# Patient Record
Sex: Female | Born: 1980 | Race: White | Hispanic: No | Marital: Single | Smoking: Never smoker
Health system: Southern US, Community
[De-identification: ages and names within clinical notes are randomized; demographics above are authoritative.]

## PROBLEM LIST (undated history)

## (undated) DIAGNOSIS — F32A Depression, unspecified: Secondary | ICD-10-CM

## (undated) DIAGNOSIS — K219 Gastro-esophageal reflux disease without esophagitis: Secondary | ICD-10-CM

## (undated) DIAGNOSIS — F329 Major depressive disorder, single episode, unspecified: Secondary | ICD-10-CM

## (undated) DIAGNOSIS — G43909 Migraine, unspecified, not intractable, without status migrainosus: Secondary | ICD-10-CM

## (undated) DIAGNOSIS — F419 Anxiety disorder, unspecified: Secondary | ICD-10-CM

## (undated) DIAGNOSIS — R42 Dizziness and giddiness: Secondary | ICD-10-CM

## (undated) DIAGNOSIS — C569 Malignant neoplasm of unspecified ovary: Secondary | ICD-10-CM

## (undated) HISTORY — DX: Anxiety disorder, unspecified: F41.9

## (undated) HISTORY — DX: Malignant neoplasm of unspecified ovary: C56.9

## (undated) HISTORY — DX: Dizziness and giddiness: R42

## (undated) HISTORY — DX: Migraine, unspecified, not intractable, without status migrainosus: G43.909

---

## 2006-03-10 DIAGNOSIS — C569 Malignant neoplasm of unspecified ovary: Secondary | ICD-10-CM

## 2006-03-10 HISTORY — DX: Malignant neoplasm of unspecified ovary: C56.9

## 2006-07-09 HISTORY — PX: SALPINGOOPHORECTOMY: SHX82

## 2006-08-09 HISTORY — PX: APPENDECTOMY: SHX54

## 2007-02-08 HISTORY — PX: BUNIONECTOMY: SHX129

## 2008-07-08 HISTORY — PX: CYSTECTOMY: SUR359

## 2011-11-06 ENCOUNTER — Encounter: Payer: Self-pay | Admitting: Gynecologic Oncology

## 2011-11-06 ENCOUNTER — Other Ambulatory Visit (HOSPITAL_COMMUNITY)
Admission: RE | Admit: 2011-11-06 | Discharge: 2011-11-06 | Disposition: A | Discharge status: Home / Self Care | Payer: BC Managed Care – PPO | Source: Ambulatory Visit | Attending: Gynecologic Oncology | Admitting: Gynecologic Oncology

## 2011-11-06 ENCOUNTER — Ambulatory Visit: Payer: BC Managed Care – PPO

## 2011-11-06 ENCOUNTER — Ambulatory Visit: Payer: BC Managed Care – PPO | Attending: Gynecologic Oncology | Admitting: Gynecologic Oncology

## 2011-11-06 VITALS — BP 98/58 | HR 68 | Temp 99.0°F | Resp 18 | Ht 60.04 in | Wt 111.3 lb

## 2011-11-06 DIAGNOSIS — Z01419 Encounter for gynecological examination (general) (routine) without abnormal findings: Secondary | ICD-10-CM | POA: Insufficient documentation

## 2011-11-06 DIAGNOSIS — C569 Malignant neoplasm of unspecified ovary: Secondary | ICD-10-CM | POA: Insufficient documentation

## 2011-11-06 NOTE — Addendum Note (Signed)
Addended by: Cleda Mccreedy A on: 11/06/2011 03:47 PM   Modules accepted: Orders

## 2011-11-06 NOTE — Progress Notes (Signed)
Consult Note: Gyn-Onc  Sharon Holmes 31 y.o. female  CC:  Chief Complaint  Patient presents with  . Ovarian Cancer    Follow up    HPI: Patient is seen today in consultation at the request of Dr. Shaune Pollack for followup of ovarian cancer.  31 year old gravida 0 and 2008 began feeling some fatigue bloating and was having some bleeding every week. She was told her that she might have polycystic ovarian syndrome. She then developed some sharp left-sided shoulder pain went to her local emergency room where an MRI was performed it revealed a 17 cm ovarian mass. She underwent exploratory laparotomy left salpingo-oophorectomy that revealed a this early stage ovarian cancer. She does not know the histology and we do not have a record of her final pathology. After that she was referred to Dr. Charlyn Minerva a gynecologic oncologist in [place]. She underwent complete staging at that time which was completely negative and she was felt to have a stage IA ovarian cancer was dispositioned to close followup. Her CA 125 was elevated at the time of her diagnosis and has been followed closely. Her CA-125 has been elevated from time to time and most recently in October 2011 they were elevated at 74. In 2010 she had an evaluation for a right ovarian mass and underwent a right ovarian cystectomy that revealed endometriosis. She was also diagnosed with left-sided pelvic adhesions. She was seen by Dr. Kevan Ny August 15 and had a CA 125 done at that time that was elevated at 55.6. Her last Pap smear was in May of 2012.  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 Her cycles are regular her last cycle was 3 weeks ago she feels that her cycles are to see a. Her most recent imaging was a CT scan in October of 2011 that showed some right soft tissue density. She did have a followup ultrasound she does not know the results. She cannot undergo MRI as if she has the pain in her foot. She is  single.  Review of Systems: 10 point review of systems is negative.  Current Meds:  Outpatient Encounter Prescriptions as of 11/06/2011  Medication Sig Dispense Refill  . IRON PO Take 325 mg by mouth every other day. Once every other day      . SUMAtriptan (IMITREX) 50 MG tablet Take 50 mg by mouth every 2 (two) hours as needed.        Allergy:  Allergies  Allergen Reactions  . Amoxicillin     High fever and aches  . Aspirin     Throat gets itchy    Social Hx:   History   Social History  . Marital Status: Single    Spouse Name: N/A    Number of Children: N/A  . Years of Education: N/A   Occupational History  . Not on file.   Social History Main Topics  . Smoking status: Never Smoker   . Smokeless tobacco: Not on file  . Alcohol Use: Yes     occas  . Drug Use: Not on file  . Sexually Active: Not on file   Other Topics Concern  . Not on file   Social History Narrative  . No narrative on file    Past Surgical Hx:  Past Surgical History  Procedure Date  . Salpingoophorectomy 07/2006    Left for Stage 1A ovarian cancer  . Appendectomy 08/2006  . Bunionectomy 02/2007  . Cystectomy 07/2008  from right ovary    Past Medical Hx:  Past Medical History  Diagnosis Date  . Dizziness   . Migraine     with aura around menses  . Ovarian cancer 2008    Stage 1A    Family Hx:  Family History  Problem Relation Age of Onset  . Diabetes Maternal Aunt   . Heart attack Maternal Grandfather     Vitals:  Blood pressure 98/58, pulse 68, temperature 99 F (37.2 C), temperature source Oral, resp. rate 18, height 5' 0.04" (1.525 m), weight 111 lb 4.8 oz (50.485 kg), last menstrual period 10/13/2011.  Physical Exam: Well-nourished well-developed female in no acute distress.  Neck: Supple, no lymphadenopathy, no family.  Lungs: Clear to auscultation bilaterally.  Cardiovascular: Regular rate and rhythm.  Abdomen: Well-healed vertical midline incision laparoscopy  incisions. There is no evidence of any incisional hernias. Abdomen is soft and nontender.  Groins: No lymphadenopathy.  Extremities: No edema.  Pelvic: Normal external female genitalia. The vagina is well epithelialized. The cervix is visualized. There's a physiologic discharge. There's no visible lesions. ThinPrep Pap smear was performed without difficulty. Bimanual examination the uterus is freely mobile. It appears to be retroflexed. There is no adnexal masses. Rectal confirms no nodularity  Assessment/Plan: 31 year old with a stage IA unknown histology ovarian carcinoma diagnosed and treated in 2008 was no evidence of recurrent disease. She has intermittently had elevated CA 125. She does carry a known diagnosis of endometriosis. Her most recent CA 125 2 weeks ago was elevated at 55. We'll schedule her for transvaginal ultrasound. We will contact her with these results. Return to see me in 6 months.  Cleda Mccreedy A., MD 11/06/2011, 12:43 PM

## 2011-11-06 NOTE — Patient Instructions (Signed)
Follow up for ultrasound and RTC in 6 months.

## 2011-11-12 ENCOUNTER — Ambulatory Visit (HOSPITAL_COMMUNITY)
Admission: RE | Admit: 2011-11-12 | Discharge: 2011-11-12 | Disposition: A | Discharge status: Home / Self Care | Payer: BC Managed Care – PPO | Source: Ambulatory Visit | Attending: Gynecologic Oncology | Admitting: Gynecologic Oncology

## 2011-11-12 DIAGNOSIS — C569 Malignant neoplasm of unspecified ovary: Secondary | ICD-10-CM

## 2011-11-12 DIAGNOSIS — Z9079 Acquired absence of other genital organ(s): Secondary | ICD-10-CM | POA: Insufficient documentation

## 2011-11-12 DIAGNOSIS — R971 Elevated cancer antigen 125 [CA 125]: Secondary | ICD-10-CM | POA: Insufficient documentation

## 2011-11-14 ENCOUNTER — Telehealth: Payer: Self-pay | Admitting: Gynecologic Oncology

## 2011-11-14 NOTE — Telephone Encounter (Signed)
Message left for patient with pap smear and ultrasound results.  Informed to follow up with Dr. Duard Brady in 6 months.  Instructed to call for any questions or concerns.

## 2012-10-21 ENCOUNTER — Ambulatory Visit: Payer: BC Managed Care – PPO | Admitting: Lab

## 2012-10-21 ENCOUNTER — Encounter: Payer: Self-pay | Admitting: Gynecologic Oncology

## 2012-10-21 ENCOUNTER — Ambulatory Visit: Payer: BC Managed Care – PPO | Attending: Gynecologic Oncology | Admitting: Gynecologic Oncology

## 2012-10-21 ENCOUNTER — Other Ambulatory Visit (HOSPITAL_COMMUNITY)
Admission: RE | Admit: 2012-10-21 | Discharge: 2012-10-21 | Disposition: A | Discharge status: Home / Self Care | Payer: BC Managed Care – PPO | Source: Ambulatory Visit | Attending: Gynecologic Oncology | Admitting: Gynecologic Oncology

## 2012-10-21 VITALS — BP 100/60 | HR 74 | Temp 98.4°F | Resp 18 | Ht 60.04 in | Wt 111.0 lb

## 2012-10-21 DIAGNOSIS — L708 Other acne: Secondary | ICD-10-CM | POA: Insufficient documentation

## 2012-10-21 DIAGNOSIS — Z01419 Encounter for gynecological examination (general) (routine) without abnormal findings: Secondary | ICD-10-CM | POA: Insufficient documentation

## 2012-10-21 DIAGNOSIS — C569 Malignant neoplasm of unspecified ovary: Secondary | ICD-10-CM

## 2012-10-21 DIAGNOSIS — Z1151 Encounter for screening for human papillomavirus (HPV): Secondary | ICD-10-CM | POA: Insufficient documentation

## 2012-10-21 NOTE — Patient Instructions (Signed)
RTC 1 year

## 2012-10-21 NOTE — Progress Notes (Signed)
Consult Note: Gyn-Onc  Sharon Holmes 32 y.o. female  CC:  Chief Complaint  Patient presents with  . Ovarian Cancer    Follow up    HPI:   32 year old gravida 0 and 2008 began feeling some fatigue bloating and was having some bleeding every week. She was told her that she might have polycystic ovarian syndrome. She then developed some sharp left-sided shoulder pain went to her local emergency room where an MRI was performed it revealed a 17 cm ovarian mass. She underwent exploratory laparotomy left salpingo-oophorectomy that revealed a this early stage ovarian cancer in 2008. She does not know the histology and we do not have a record of her final pathology from that surgery. After that she was referred to Dr. Charlyn Minerva a gynecologic oncologist in [place]. She underwent complete staging at that time (that pathology is availalble) which was completely negative (including appendectomy) and she was felt to have a stage IA ovarian cancer was dispositioned to close followup. Her CA 125 was elevated at the time of her diagnosis and has been followed closely. Her CA-125 has been elevated from time to time and most recently in October 2011 they were elevated at 74. In 2010 she had an evaluation for a right ovarian mass and underwent a right ovarian cystectomy that revealed endometriosis. She was also diagnosed with left-sided pelvic adhesions. October 23, 2011 CA 125  was elevated at 55.6. Her last Pap smear was normal in 11/06/11.  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 She does have some breast tenderness week before her cycles. Her cycles about to start today and she has her menstrual cycles reliably every 3-4 weeks. She occasionally has some ovarian discomfort she believes  corresponds to ovulation in the right lower quadrant. She has mild cramping with her menstrual cycles. She has a brother pain or pressure. She has not seen Dr. Kevan Ny since we saw her last year. There are no new medical problems and her family. Her mother had Graves' disease and had a thyroidectomy and is currently on Synthroid. She also has fibromyalgia. She's not had a breast exam.  Review of Systems:   Constitutional: Feeling well Skin: No rash, sores, jaundice, itching, or dryness.  Cardiovascular: No chest pain, shortness of breath, or edema  Pulmonary: No cough or wheeze.  Gastro Intestinal: No nausea, vomiting, constipation, or diarrhea reported. No bright red blood per rectum or change in bowel movement.  Genitourinary: No frequency, urgency, or dysuria.  Denies vaginal bleeding and discharge.  Musculoskeletal: No myalgia, arthralgia, joint swelling or pain.  Neurologic: No weakness, numbness, or change in gait.  Psychology: No issues .  Current Meds:  Outpatient Encounter Prescriptions as of 10/21/2012  Medication Sig Dispense Refill  . LORATADINE PO Take 10 mg by mouth daily.      . IRON PO Take 325 mg by mouth every other day. Once every other day      . SUMAtriptan (IMITREX) 50 MG tablet Take 50 mg by mouth every 2 (two) hours as needed.       No facility-administered encounter medications on file as of 10/21/2012.    Allergy:  Allergies  Allergen Reactions  . Amoxicillin     High fever and aches  .  Aspirin     Throat gets itchy  . Nsaids     Social Hx:   History   Social History  . Marital Status: Single    Spouse Name: N/A    Number of Children: N/A  . Years of Education: N/A   Occupational History  . Not on file.   Social History Main Topics  . Smoking status: Never Smoker   . Smokeless tobacco: Not on file  . Alcohol Use: Yes     Comment: occas  . Drug Use: Not on file  . Sexual Activity: Not on file   Other Topics Concern  . Not on file   Social  History Narrative  . No narrative on file    Past Surgical Hx:  Past Surgical History  Procedure Laterality Date  . Salpingoophorectomy  07/2006    Left for Stage 1A ovarian cancer  . Appendectomy  08/2006  . Bunionectomy  02/2007  . Cystectomy  07/2008    from right ovary    Past Medical Hx:  Past Medical History  Diagnosis Date  . Dizziness   . Migraine     with aura around menses  . Ovarian cancer 2008    Stage 1A    Family Hx:  Family History  Problem Relation Age of Onset  . Diabetes Maternal Aunt   . Heart attack Maternal Grandfather     Vitals:  Blood pressure 100/60, pulse 74, temperature 98.4 F (36.9 C), temperature source Oral, resp. rate 18, height 5' 0.04" (1.525 m), weight 111 lb (50.349 kg), last menstrual period 10/21/2012.  Physical Exam: Well-nourished well-developed female in no acute distress.  Neck: Supple, no lymphadenopathy, no family.  Lungs: Clear to auscultation bilaterally.  Cardiovascular: Regular rate and rhythm.  Breasts: No palpable masses, no skin changes, no nipple discharge.  Abdomen: Well-healed vertical midline incision.There is no evidence of any incisional hernias. Abdomen is soft and nontender.  Groins: No lymphadenopathy.  Extremities: No edema.  Pelvic: Normal external female genitalia. The vagina is well epithelialized. The cervix is visualized. There's a physiologic discharge with slight menstrual flow. There's no visible lesions. ThinPrep Pap smear was performed without difficulty. Bimanual examination the uterus is freely mobile. It appears to be retroflexed. There is no adnexal masses. Rectal confirms no nodularity.  Assessment/Plan: 32 year old with a stage IA unknown histology ovarian carcinoma diagnosed and treated in 2008 was no evidence of recurrent disease. She has intermittently had elevated CA 125 we will followup on the results of her CA 125 and Pap smear from today. I did send her Pap smear for HPV testing.  She will also be seeing a dermatologist secondary to acne on her chin. If the dermatologist feels that there is a benefit for her to be on oral contraceptives, I see no contraindications for that.   Sharon Mccaffery A., MD 10/21/2012, 10:23 AM

## 2012-10-22 ENCOUNTER — Telehealth: Payer: Self-pay | Admitting: Gynecologic Oncology

## 2012-10-22 LAB — CA 125: CA 125: 26.3 U/mL (ref 0.0–30.2)

## 2012-10-22 NOTE — Telephone Encounter (Signed)
Message left with CA 125 results.  Instructed to call the office for any concerns.

## 2012-10-25 ENCOUNTER — Telehealth: Payer: Self-pay | Admitting: *Deleted

## 2012-10-27 NOTE — Telephone Encounter (Signed)
Notified patient of Pap results 

## 2013-03-10 HISTORY — PX: WISDOM TOOTH EXTRACTION: SHX21

## 2013-10-17 ENCOUNTER — Other Ambulatory Visit: Payer: BC Managed Care – PPO

## 2013-10-17 ENCOUNTER — Encounter: Payer: Self-pay | Admitting: Gynecologic Oncology

## 2013-10-17 ENCOUNTER — Ambulatory Visit: Payer: BC Managed Care – PPO | Attending: Gynecologic Oncology | Admitting: Gynecologic Oncology

## 2013-10-17 VITALS — BP 108/66 | HR 72 | Temp 98.6°F | Resp 20 | Ht 60.0 in | Wt 128.1 lb

## 2013-10-17 DIAGNOSIS — C562 Malignant neoplasm of left ovary: Secondary | ICD-10-CM

## 2013-10-17 DIAGNOSIS — C569 Malignant neoplasm of unspecified ovary: Secondary | ICD-10-CM | POA: Insufficient documentation

## 2013-10-17 DIAGNOSIS — R971 Elevated cancer antigen 125 [CA 125]: Secondary | ICD-10-CM | POA: Insufficient documentation

## 2013-10-17 DIAGNOSIS — Z8049 Family history of malignant neoplasm of other genital organs: Secondary | ICD-10-CM

## 2013-10-17 DIAGNOSIS — IMO0001 Reserved for inherently not codable concepts without codable children: Secondary | ICD-10-CM | POA: Insufficient documentation

## 2013-10-17 NOTE — Patient Instructions (Signed)
We will contact you with the results of your CA 125.  Plan to follow up in one year or sooner if needed.  Please call for any questions or concerns.

## 2013-10-17 NOTE — Progress Notes (Signed)
Consult Note: Gyn-Onc  Sharon Holmes 33 y.o. female  CC:  Chief Complaint  Patient presents with  . Ovarian Cancer    Follow up    HPI:   32 year old gravida 0 and 2008 began feeling some fatigue bloating and was having some bleeding every week. She was told her that she might have polycystic ovarian syndrome. She then developed some sharp left-sided shoulder pain went to her local emergency room where an MRI was performed it revealed a 17 cm ovarian mass. She underwent exploratory laparotomy left salpingo-oophorectomy that revealed a this early stage ovarian cancer in 2008. She does not know the histology and we do not have a record of her final pathology from that surgery. After that she was referred to Dr. Susa Day a gynecologic oncologist in [place]. She underwent complete staging at that time (that pathology is availalble) which was completely negative (including appendectomy) and she was felt to have a stage IA ovarian cancer was dispositioned to close followup. Her CA 125 was elevated at the time of her diagnosis and has been followed closely. Her CA-125 has been elevated from time to time and most recently in October 2011 they were elevated at 74. In 2010 she had an evaluation for a right ovarian mass and underwent a right ovarian cystectomy that revealed endometriosis. She was also diagnosed with left-sided pelvic adhesions. October 23, 2011 CA 125  was elevated at 55.6. Her last Pap smear was normal in 11/06/11.  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 She does have some breast tenderness week before her cycles.She occasionally has some ovarian discomfort she believes corresponds to ovulation in the right lower quadrant. She has mild cramping with her menstrual cycles. She has a brother pain or  pressure. She has not seen Dr. Inda Merlin since we saw her last year. There are no new medical problems and her family. Her mother had Graves' disease and had a thyroidectomy and is currently on Synthroid. She also has fibromyalgia.   She had a history of anxiety and depression and she has seen a psychiatrist in the past year who prescribed Lexapro. This has improved her symptoms and she noticed weight gain with this (though she had experienced weight loss before this treatment began).  Review of Systems:   Constitutional: Feeling well Skin: No rash, sores, jaundice, itching, or dryness.  Cardiovascular: No chest pain, shortness of breath, or edema  Pulmonary: No cough or wheeze.  Gastro Intestinal: No nausea, vomiting, constipation, or diarrhea reported. No bright red blood per rectum or change in bowel movement.  Genitourinary: No frequency, urgency, or dysuria.  Denies vaginal bleeding and discharge.  Musculoskeletal: No myalgia, arthralgia, joint swelling or pain.  Neurologic: No weakness, numbness, or change in gait.  Psychology: No issues .  Current Meds:  Outpatient Encounter Prescriptions as of 10/17/2013  Medication Sig  . ALPRAZolam (XANAX) 0.5 MG tablet Take 0.5 mg by mouth 3 (three) times daily as needed for anxiety.  Marland Kitchen BIOTIN PO Take by mouth.  . escitalopram (LEXAPRO) 10 MG tablet Take 10 mg by mouth daily.  . Multiple Vitamins-Minerals (MULTIVITAMIN PO) Take by mouth.  Marland Kitchen LORATADINE PO Take 10 mg by mouth daily.    Allergy:  Allergies  Allergen Reactions  . Amoxicillin     High  fever and aches  . Aspirin     Throat gets itchy  . Nsaids     Social Hx:   History   Social History  . Marital Status: Single    Spouse Name: N/A    Number of Children: N/A  . Years of Education: N/A   Occupational History  . Not on file.   Social History Main Topics  . Smoking status: Never Smoker   . Smokeless tobacco: Not on file  . Alcohol Use: Yes     Comment: occas  . Drug  Use: Not on file  . Sexual Activity: Not on file   Other Topics Concern  . Not on file   Social History Narrative  . No narrative on file    Past Surgical Hx:  Past Surgical History  Procedure Laterality Date  . Salpingoophorectomy  07/2006    Left for Stage 1A ovarian cancer  . Appendectomy  08/2006  . Bunionectomy  02/2007  . Cystectomy  07/2008    from right ovary  . Wisdom tooth extraction  2015    Past Medical Hx:  Past Medical History  Diagnosis Date  . Dizziness   . Migraine     with aura around menses  . Ovarian cancer 2008    Stage 1A  . Anxiety     Family Hx:  Family History  Problem Relation Age of Onset  . Diabetes Maternal Aunt   . Heart attack Maternal Grandfather     Vitals:  Blood pressure 108/66, pulse 72, temperature 98.6 F (37 C), temperature source Oral, resp. rate 20, height 5' (1.524 m), weight 128 lb 1.6 oz (58.106 kg).  Physical Exam: Well-nourished well-developed female in no acute distress.  Neck: Supple, no lymphadenopathy, no family.  Lungs: Clear to auscultation bilaterally.  Cardiovascular: Regular rate and rhythm.  Breasts: No palpable masses, no skin changes, no nipple discharge.  Abdomen: Well-healed vertical midline incision.There is no evidence of any incisional hernias. Abdomen is soft and nontender.  Groins: No lymphadenopathy.  Extremities: No edema.  Pelvic: Normal external female genitalia. The vagina is well epithelialized. The cervix is visualized. There's no visible lesions. Bimanual examination the uterus is freely mobile. It appears to be retroflexed. There is no adnexal masses. Rectal confirms no nodularity.  Assessment/Plan: 33 year old with a stage IA unknown histology ovarian carcinoma diagnosed and treated in 2008 was no evidence of recurrent disease. She has intermittently had elevated CA 125 we will followup on the results of her CA 125 from today.   Her pap from 8/14 revealed normal cytology and  negative for HRHPV. Based on current ASCCP and ACOG recommendations, she requires repeat cervical cytology with HPV screening in 5 years (2019).   [name], MD 10/17/2013, 1:15 PM

## 2013-10-18 ENCOUNTER — Other Ambulatory Visit: Payer: Self-pay | Admitting: Gynecologic Oncology

## 2013-10-18 ENCOUNTER — Telehealth: Payer: Self-pay | Admitting: *Deleted

## 2013-10-18 ENCOUNTER — Telehealth: Payer: Self-pay | Admitting: Gynecologic Oncology

## 2013-10-18 DIAGNOSIS — C569 Malignant neoplasm of unspecified ovary: Secondary | ICD-10-CM

## 2013-10-18 DIAGNOSIS — C562 Malignant neoplasm of left ovary: Secondary | ICD-10-CM

## 2013-10-18 LAB — CA 125: CA 125: 93.6 U/mL — ABNORMAL HIGH (ref 0.0–30.2)

## 2013-10-18 NOTE — Telephone Encounter (Signed)
Message left for patient per her request.  Advised of CA 125 level of 93.6 and Dr. Serita Grit recommendations to proceed with a transvaginal ultrasound to evaluate left ovary for masses/cysts.  Advised to please call the office to let Sharon Holmes know what date and time would work for her to have her ultrasound.

## 2013-10-18 NOTE — Progress Notes (Signed)
Elevatd ca125, hx of ovarian cancer. Premenopausal. Hx of LSO and staging (still has right tube and ovary and uterus).  Need TVUS to evaluate right ovary for pathology.

## 2013-10-18 NOTE — Telephone Encounter (Deleted)
Message copied by Joylene John D on Tue Oct 18, 2013 11:40 AM ------      Message from: Everitt Amber C      Created: Tue Oct 18, 2013  7:33 AM       CA 125 substantially elevated. May be consistent with patieht's past trends (fluctuating CA 125).      However, lets obtain a transvaginal US to evaluate the patient's remaining left ovary for masses/cysts.      Order in [place].      Would you mind calling the patient and letting her know that, in accordance with our stated plan yesterday, we will obtain an [place] to evaluate the ovary given that CA125 is >50.       Thanks      Terrence Dupont ------

## 2013-10-19 NOTE — Telephone Encounter (Signed)
Pt called gave her appt for [place] on Monday Aug 17 at [facility]. Pt confirmed appt, advised we will call her with results.

## 2013-10-24 ENCOUNTER — Ambulatory Visit (HOSPITAL_COMMUNITY)
Admission: RE | Admit: 2013-10-24 | Discharge: 2013-10-24 | Disposition: A | Discharge status: Home / Self Care | Payer: BC Managed Care – PPO | Source: Ambulatory Visit | Attending: Gynecologic Oncology | Admitting: Gynecologic Oncology

## 2013-10-24 DIAGNOSIS — C569 Malignant neoplasm of unspecified ovary: Secondary | ICD-10-CM | POA: Diagnosis not present

## 2013-10-25 ENCOUNTER — Telehealth: Payer: Self-pay | Admitting: *Deleted

## 2013-10-25 NOTE — Telephone Encounter (Signed)
Notes Recorded by Everitt Amber, MD on 10/24/2013 at 1:35 PM Melissa, would you mind letting her know that this is a reassuring finding. I see no signs concerning for ovarian cancer or recurrence. Everitt Amber  Pt called requested results. Per MD's message gave pt results. Pt verbalized understanding no further concerns.

## 2013-12-07 LAB — BASIC METABOLIC PANEL
BUN: 15 mg/dL (ref 4–21)
CREATININE: 0.6 mg/dL (ref 0.5–1.1)
GLUCOSE: 94 mg/dL
Potassium: 4.1 mmol/L (ref 3.4–5.3)
Sodium: 136 mmol/L — AB (ref 137–147)

## 2013-12-07 LAB — HEPATIC FUNCTION PANEL
ALT: 54 U/L — AB (ref 7–35)
AST: 28 U/L (ref 13–35)
Alkaline Phosphatase: 65 U/L (ref 25–125)
Bilirubin, Total: 0.4 mg/dL

## 2013-12-07 LAB — CBC AND DIFFERENTIAL
HCT: 42 % (ref 36–46)
HEMOGLOBIN: 14.1 g/dL (ref 12.0–16.0)
Platelets: 241 10*3/uL (ref 150–399)
WBC: 8.9 10*3/mL

## 2013-12-07 LAB — TSH: TSH: 2.19 u[IU]/mL (ref 0.41–5.90)

## 2013-12-12 ENCOUNTER — Other Ambulatory Visit: Payer: Self-pay | Admitting: Family Medicine

## 2013-12-12 DIAGNOSIS — R5383 Other fatigue: Secondary | ICD-10-CM

## 2013-12-12 DIAGNOSIS — R7401 Elevation of levels of liver transaminase levels: Secondary | ICD-10-CM

## 2013-12-12 DIAGNOSIS — C562 Malignant neoplasm of left ovary: Secondary | ICD-10-CM

## 2013-12-12 DIAGNOSIS — R740 Nonspecific elevation of levels of transaminase and lactic acid dehydrogenase [LDH]: Secondary | ICD-10-CM

## 2013-12-15 ENCOUNTER — Telehealth: Payer: Self-pay | Admitting: Gynecologic Oncology

## 2013-12-15 NOTE — Telephone Encounter (Signed)
Patient called this am requesting advice.  Stating recently she has been "very tired, sleeping 12 to 13 hours a day with weight gain as well."  She saw her PCP and reports her labs were normal except for her "liver enzymes 2 points over normal."  Her PCP recommended that she have a CT abdomen/pelvis and she wanted to check with Dr. Denman George for her recommendations on the situation.  Patient advised that she can continue with having the CT scan per Dr. Denman George.  Advised to call for any questions or concerns.

## 2013-12-23 ENCOUNTER — Ambulatory Visit
Admission: RE | Admit: 2013-12-23 | Discharge: 2013-12-23 | Disposition: A | Discharge status: Home / Self Care | Payer: BC Managed Care – PPO | Source: Ambulatory Visit | Attending: Family Medicine | Admitting: Family Medicine

## 2013-12-23 DIAGNOSIS — C562 Malignant neoplasm of left ovary: Secondary | ICD-10-CM

## 2013-12-23 DIAGNOSIS — R7401 Elevation of levels of liver transaminase levels: Secondary | ICD-10-CM

## 2013-12-23 DIAGNOSIS — R740 Nonspecific elevation of levels of transaminase and lactic acid dehydrogenase [LDH]: Secondary | ICD-10-CM

## 2013-12-23 DIAGNOSIS — R5383 Other fatigue: Secondary | ICD-10-CM

## 2014-01-12 ENCOUNTER — Telehealth: Payer: Self-pay

## 2014-01-12 NOTE — Telephone Encounter (Signed)
Rec'd From [facility] @ [facility] forward 31 pages to Endocrinology Historical provider

## 2014-02-15 ENCOUNTER — Encounter: Payer: Self-pay | Admitting: Endocrinology

## 2014-03-13 ENCOUNTER — Encounter: Payer: Self-pay | Admitting: Internal Medicine

## 2014-03-13 ENCOUNTER — Other Ambulatory Visit: Payer: Self-pay | Admitting: *Deleted

## 2014-03-13 ENCOUNTER — Ambulatory Visit (INDEPENDENT_AMBULATORY_CARE_PROVIDER_SITE_OTHER): Payer: BC Managed Care – PPO | Admitting: Internal Medicine

## 2014-03-13 ENCOUNTER — Other Ambulatory Visit: Payer: Self-pay | Admitting: Internal Medicine

## 2014-03-13 VITALS — BP 100/62 | HR 87 | Temp 97.8°F | Resp 12 | Ht 60.0 in | Wt 134.0 lb

## 2014-03-13 DIAGNOSIS — R5382 Chronic fatigue, unspecified: Secondary | ICD-10-CM

## 2014-03-13 DIAGNOSIS — R635 Abnormal weight gain: Secondary | ICD-10-CM

## 2014-03-13 DIAGNOSIS — Z8349 Family history of other endocrine, nutritional and metabolic diseases: Secondary | ICD-10-CM

## 2014-03-13 NOTE — Progress Notes (Signed)
Patient ID: Sharon Holmes, female   DOB: [birth year], 34 y.o.   MRN: [id number]   HPI  Sharon Holmes is a 34 y.o.-year-old female, self-referred for FH of hypothyroidism and also fatigue/weight gain.  She has + FH of thyroid disorders in: mother with Graves disease, on LT4 now, MGM hypothyroid. No FH of thyroid cancer.   I reviewed pt's latest thyroid tests: Lab Results  Component Value Date   TSH 2.19 12/07/2013    Pt describes: - + weight gain - 25 lbs in last 6 mo: 110 1 year ago >> 134 lbs now. She started a cleansing diet today.  - + fatigue - exacerbated in 1 year  - + fluid retention - + increased thirst  - + heat intolerance, + sweating in am - + depression >> started on SSRI (Lexapro) - no constipation - + oily skin - no hair loss - + acne since 10 - + regular menses  No h/o steroid inj or courses.   Pt denies feeling nodules in neck, hoarseness, dysphagia/odynophagia, SOB with lying down.  No h/o radiation tx to head or neck. No recent use of iodine supplements. She had a CT with contrast in 11/2013. She now gets CT scans once a year.  Aunt: DM1  I reviewed her chart and she also has a history of ovarian cancer stage Ia dx at 34 y/o - s/p oophorectomy.  ROS: Constitutional: + weight gain, + fatigue, + subjective hyperthermia Eyes: no blurry vision, no xerophthalmia ENT: no sore throat, no nodules palpated in throat, no dysphagia/odynophagia, no hoarseness Cardiovascular: no CP/+ SOB/no palpitations/+ leg swelling Respiratory: no cough/+ SOB Gastrointestinal: no N/V/D/C Musculoskeletal: no muscle/joint aches Skin: + rash, + itching, + acne face Neurological: no tremors/numbness/tingling/dizziness Psychiatric: + both: depression/anxiety  Past Medical History  Diagnosis Date  . Dizziness   . Migraine     with aura around menses  . Anxiety   . Ovarian cancer 2008    Stage 1A  . Thyroid disease    Past Surgical History  Procedure Laterality Date  .  Salpingoophorectomy  07/2006    Left for Stage 1A ovarian cancer  . Appendectomy  08/2006  . Bunionectomy  02/2007  . Cystectomy  07/2008    from right ovary  . Wisdom tooth extraction  2015   History   Social History  . Marital Status: Single    Spouse Name: N/A    Number of Children: 0   Social History Main Topics  . Smoking status: Never Smoker   . Smokeless tobacco: Not on file  . Alcohol Use: 0.6 - 1.2 oz/week    1-2 Not specified per week     Comment: occas  . 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 escitalopram (LEXAPRO) 10 MG tablet Take 10 mg by mouth daily.    . Multiple Vitamins-Minerals (MULTIVITAMIN PO) Take by mouth.    . ALPRAZolam (XANAX) 0.5 MG tablet Take 0.5 mg by mouth 3 (three) times daily as needed for anxiety.    Marland Kitchen BIOTIN PO Take by mouth.    Marland Kitchen LORATADINE PO Take 10 mg by mouth daily.     No current facility-administered medications on file prior to visit.   Allergies  Allergen Reactions  .  Amoxicillin     High fever and aches  . Aspirin     Throat gets itchy  . Nsaids Itching    Itchy throat and gums, throat closes up; palpitations    Family History  Problem Relation Age of Onset  . Diabetes Maternal Aunt   . Heart attack Maternal Grandfather   . Heart disease Maternal Grandfather     Heart attack; CAD   . Fibromyalgia Mother   . Thyroid disease Mother   . Blindness Father   . Alcohol abuse Father    PE: BP 100/62 mmHg  Pulse 87  Temp(Src) 97.8 F (36.6 C) (Oral)  Resp 12  Ht 5' (1.524 m)  Wt 134 lb (60.782 kg)  BMI 26.17 kg/m2  SpO2 97% Wt Readings from Last 3 Encounters:  03/13/14 134 lb (60.782 kg)  10/17/13 128 lb 1.6 oz (58.106 kg)  10/21/12 111 lb (50.349 kg)   Constitutional: overweight, in NAD Eyes: PERRLA,  EOMI, no exophthalmos ENT: moist mucous membranes, no thyromegaly, no cervical lymphadenopathy Cardiovascular: RRR, No MRG Respiratory: CTA B Gastrointestinal: abdomen soft, NT, ND, BS+ Musculoskeletal: no deformities, strength intact in all 4 Skin: moist, warm, no rashes except acne on chin and side of face Neurological: no tremor with outstretched hands, DTR normal in all 4  ASSESSMENT: 1. FH of thyroid ds  2. Fatigue   3. Weight gain  PLAN:  1. Patient with FH of hyperthyroidism in mother and hypothyroidism in GM - reviewed her recent TSH level (3 mo ago) >> normal - will check thyroid tests today: TSH, free T4, free T3 and also TPO Abs  2. Fatigue and 3. Weight gain - We discussed about possible causes for fatigue, for e.g.: - diet - discussed about the benefits of a plant-based diet; she started a diet today  - sedentarism - started exercise recently - not sleeping well - she is hot at night - depression - on Lexapro - menopause/pregnancy - she has light menses every other month (post oophorectomy) - will check LH, FSH, Beta HCG - polypharmacy - no - vitamin D def - will check vit D level  - vitamin B12 def - will check B12 level  - anemia - last Hb normal, reviewed from 12/07/2013 - hypothyroidism - last TSH normal, will check TFTs and TPO antibodies today - recent steroid use - no - [place] sd. - low suspicion but check a 24h urine collection for cortisol - diabetes - check HbA1c - rheumatologic disorders - check ESR  Will check the following tests: Orders Placed This Encounter  Procedures  . HgB A1c  . Vitamin D (25 hydroxy)  . B12  . Sed Rate (ESR)  . LH  . FSH  . hCG, quantitative, pregnancy  . TSH  . T4, free  . T3, free  . Thyroid Peroxidase Antibody  . Cortisol, urine, 24 hour  . Creatinine, urine, 24 hour   Component     Latest Ref Rng 03/13/2014  Hgb A1c MFr Bld     <5.7 % 5.5  Mean Plasma Glucose     <117 mg/dL 171  TSH     3.977 - 0.569  uIU/mL 1.898  Sed Rate     0 - 22 mm/hr 4  Vitamin B-12     211 - 911 pg/mL 783  FSH      3.6  LH      4.0  hCG, Beta Chain, Quant, S      <2.0  T3,  Free     2.3 - 4.2 pg/mL 3.2  Vit D, 25-Hydroxy     30 - 100 ng/mL 26 (L)   Mild vitamin D def. Will recommend 2000 IU vit D OTC. No pregnancy. FSH and LH normal >> no early menopause. No thyroid ds. No diabetes/prediabetes. Will await 24h UFC results.  03/31/2014 [facility] still pending.

## 2014-03-13 NOTE — Patient Instructions (Signed)
Please stop at the lab downstairs. We will schedule a new appointment if the results are abnormal.

## 2014-03-14 LAB — VITAMIN B12: Vitamin B-12: 783 pg/mL (ref 211–911)

## 2014-03-14 LAB — HEMOGLOBIN A1C
Hgb A1c MFr Bld: 5.5 % (ref <5.7)
Mean Plasma Glucose: 111 mg/dL (ref <117)

## 2014-03-14 LAB — T3, FREE: T3, Free: 3.2 pg/mL (ref 2.3–4.2)

## 2014-03-14 LAB — LUTEINIZING HORMONE: LH: 4 m[IU]/mL

## 2014-03-14 LAB — FOLLICLE STIMULATING HORMONE: FSH: 3.6 m[IU]/mL

## 2014-03-14 LAB — HCG, QUANTITATIVE, PREGNANCY: hCG, Beta Chain, Quant, S: <2 m[IU]/mL

## 2014-03-14 LAB — TSH: TSH: 1.898 u[IU]/mL (ref 0.350–4.500)

## 2014-03-14 LAB — VITAMIN D 25 HYDROXY (VIT D DEFICIENCY, FRACTURES): Vit D, 25-Hydroxy: 26 ng/mL — ABNORMAL LOW (ref 30–100)

## 2014-03-14 LAB — SEDIMENTATION RATE: Sed Rate: 4 mm/hr (ref 0–22)

## 2014-03-15 ENCOUNTER — Encounter: Payer: Self-pay | Admitting: *Deleted

## 2014-04-04 ENCOUNTER — Encounter: Payer: Self-pay | Admitting: Internal Medicine

## 2014-10-07 ENCOUNTER — Ambulatory Visit (HOSPITAL_COMMUNITY)
Admission: RE | Admit: 2014-10-07 | Discharge: 2014-10-07 | Disposition: A | Discharge status: Home / Self Care | Payer: BC Managed Care – PPO | Attending: Psychiatry | Admitting: Psychiatry

## 2014-10-07 DIAGNOSIS — F410 Panic disorder [episodic paroxysmal anxiety] without agoraphobia: Secondary | ICD-10-CM | POA: Diagnosis not present

## 2014-10-07 DIAGNOSIS — F331 Major depressive disorder, recurrent, moderate: Secondary | ICD-10-CM | POA: Insufficient documentation
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
## 2014-12-22 ENCOUNTER — Other Ambulatory Visit: Payer: Self-pay

## 2015-01-01 ENCOUNTER — Ambulatory Visit: Payer: BC Managed Care – PPO | Admitting: Gynecologic Oncology

## 2015-01-19 ENCOUNTER — Encounter: Payer: Self-pay | Admitting: Gynecologic Oncology

## 2015-01-19 ENCOUNTER — Ambulatory Visit: Payer: BC Managed Care – PPO | Attending: Gynecologic Oncology | Admitting: Gynecologic Oncology

## 2015-01-19 VITALS — BP 123/73 | HR 94 | Temp 98.4°F | Resp 20 | Ht 60.0 in | Wt 133.2 lb

## 2015-01-19 DIAGNOSIS — C562 Malignant neoplasm of left ovary: Secondary | ICD-10-CM | POA: Diagnosis present

## 2015-01-19 NOTE — Patient Instructions (Signed)
Plan to meet with Dr. Sabra Heck about placement of an implanon.  Plan to follow up with Dr. Denman George in one year or sooner if needed.  Please call in the summer of 2017 to schedule your appointment for Nov 2017.  Please call for any questions or concerns.  Etonogestrel implant What is this medicine? ETONOGESTREL (et oh noe JES trel) is a contraceptive (birth control) device. It is used to prevent pregnancy. It can be used for up to 3 years. This medicine may be used for other purposes; ask your health care provider or pharmacist if you have questions. What should I tell my health care provider before I take this medicine? They need to know if you have any of these conditions: -abnormal vaginal bleeding -blood vessel disease or blood clots -cancer of the breast, cervix, or liver -depression -diabetes -gallbladder disease -headaches -heart disease or recent heart attack -high blood pressure -high cholesterol -kidney disease -liver disease -renal disease -seizures -tobacco smoker -an unusual or allergic reaction to etonogestrel, other hormones, anesthetics or antiseptics, medicines, foods, dyes, or preservatives -pregnant or trying to get pregnant -breast-feeding How should I use this medicine? This device is inserted just under the skin on the inner side of your upper arm by a health care professional. Talk to your pediatrician regarding the use of this medicine in children. Special care may be needed. Overdosage: If you think you have taken too much of this medicine contact a poison control center or emergency room at once. NOTE: This medicine is only for you. Do not share this medicine with others. What if I miss a dose? This does not apply. What may interact with this medicine? Do not take this medicine with any of the following medications: -amprenavir -bosentan -fosamprenavir This medicine may also interact with the following medications: -barbiturate medicines for inducing sleep  or treating seizures -certain medicines for fungal infections like ketoconazole and itraconazole -griseofulvin -medicines to treat seizures like carbamazepine, felbamate, oxcarbazepine, phenytoin, topiramate -modafinil -phenylbutazone -rifampin -some medicines to treat HIV infection like atazanavir, indinavir, lopinavir, nelfinavir, tipranavir, ritonavir -St. John's wort This list may not describe all possible interactions. Give your health care provider a list of all the medicines, herbs, non-prescription drugs, or dietary supplements you use. Also tell them if you smoke, drink alcohol, or use illegal drugs. Some items may interact with your medicine. What should I watch for while using this medicine? This product does not protect you against HIV infection (AIDS) or other sexually transmitted diseases. You should be able to feel the implant by pressing your fingertips over the skin where it was inserted. Contact your doctor if you cannot feel the implant, and use a non-hormonal birth control method (such as condoms) until your doctor confirms that the implant is in place. If you feel that the implant may have broken or become bent while in your arm, contact your healthcare provider. What side effects may I notice from receiving this medicine? Side effects that you should report to your doctor or health care professional as soon as possible: -allergic reactions like skin rash, itching or hives, swelling of the face, lips, or tongue -breast lumps -changes in emotions or moods -depressed mood -heavy or prolonged menstrual bleeding -pain, irritation, swelling, or bruising at the insertion site -scar at site of insertion -signs of infection at the insertion site such as fever, and skin redness, pain or discharge -signs of pregnancy -signs and symptoms of a blood clot such as breathing problems; changes in vision; chest  pain; severe, sudden headache; pain, swelling, warmth in the leg; trouble  speaking; sudden numbness or weakness of the face, arm or leg -signs and symptoms of liver injury like dark yellow or brown urine; general ill feeling or flu-like symptoms; light-colored stools; loss of appetite; nausea; right upper belly pain; unusually weak or tired; yellowing of the eyes or skin -unusual vaginal bleeding, discharge -signs and symptoms of a stroke like changes in vision; confusion; trouble speaking or understanding; severe headaches; sudden numbness or weakness of the face, arm or leg; trouble walking; dizziness; loss of balance or coordination Side effects that usually do not require medical attention (Report these to your doctor or health care professional if they continue or are bothersome.): -acne -back pain -breast pain -changes in weight -dizziness -general ill feeling or flu-like symptoms -headache -irregular menstrual bleeding -nausea -sore throat -vaginal irritation or inflammation This list may not describe all possible side effects. Call your doctor for medical advice about side effects. You may report side effects to FDA at 1-800-FDA-1088. Where should I keep my medicine? This drug is given in a hospital or clinic and will not be stored at home. NOTE: This sheet is a summary. It may not cover all possible information. If you have questions about this medicine, talk to your doctor, pharmacist, or health care provider.    2016, Elsevier/Gold Standard. (2013-12-09 14:07:06)

## 2015-01-19 NOTE — Progress Notes (Signed)
Follow-up Note: Gyn-Onc  Sharon Holmes 34 y.o. female  CC:  Chief Complaint  Patient presents with  . Ovarian Cancer    follow up    Assessment/Plan: 34 year old with a stage IA unknown histology ovarian carcinoma diagnosed and treated in 2008 was no evidence of recurrent disease. She has intermittently had elevated CA 125 with normal imaging.   Her pap from 8/14 revealed normal cytology and negative for HRHPV. Based on current ASCCP and ACOG recommendations, she requires repeat cervical cytology with HPV screening in 5 years (2019).  She is interested in implanon birth control. I will refer her to Dr Edwinna Areola for discussion regarding this as I do not perform insertion of these.  I will see her in 12 months for ongoing surveillance.  HPI:   34 year old gravida 0 (DES grand daughter) in 2008 began feeling some fatigue bloating and was having some bleeding every week. She was told her that she might have polycystic ovarian syndrome. She then developed some sharp left-sided shoulder pain went to her local emergency room where an MRI was performed it revealed a 17 cm ovarian mass. She underwent exploratory laparotomy left salpingo-oophorectomy that revealed a this early stage ovarian cancer in 2008. She does not know the histology and we do not have a record of her final pathology from that surgery. After that she was referred to Dr. Susa Day a gynecologic oncologist in [place]. She underwent complete staging at that time (that pathology is availalble) which was completely negative (including appendectomy) and she was felt to have a stage IA ovarian cancer was dispositioned to close followup. Her CA 125 was elevated at the time of her diagnosis and has been followed closely. Her CA-125 has been elevated from time to time and most recently in October 2011 they were elevated at 74. In 2010 she had an evaluation for a right ovarian mass and underwent a right ovarian cystectomy that revealed  endometriosis. She was also diagnosed with left-sided pelvic adhesions. October 23, 2011 CA 125  was elevated at 55.6. Her last Pap smear was normal in 11/06/11.  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 She does have some breast tenderness week before her cycles.She occasionally has some ovarian discomfort she believes corresponds to ovulation in the right lower quadrant. She has mild cramping with her menstrual cycles. She has a brother pain or pressure. There are no new medical problems and her family. Her mother had Graves' disease and had a thyroidectomy and is currently on Synthroid. She also has fibromyalgia.   She has declined genetic screening. She had a history of anxiety and depression and she has seen a psychiatrist in the past year who prescribed Lexapro. This has improved her symptoms and she noticed weight gain with this (though she had experienced weight loss before this treatment began).  Interval History:  She had an elevated CA 125 when I saw her in 2015, (90's) however, a CT scan in October, 2015 showed no evidence of recurrence. It is normal for her CA 125 values to wax and wane. A CA 125 1 month ago was 75.  She is interested in birth control. She does not want an IUD. She does not like how she feels when she takes oral contraceptives. She has taken a nuvaring before and does not  like the sensation of this. She is interested in implanon.  Review of Systems:   Constitutional: Feeling well Skin: No rash, sores, jaundice, itching, or dryness.  Cardiovascular: No chest pain, shortness of breath, or edema  Pulmonary: No cough or wheeze.  Gastro Intestinal: No nausea, vomiting, constipation, or diarrhea reported. No bright red blood per rectum or change in bowel movement.  Genitourinary: No frequency, urgency,  or dysuria.  Denies vaginal bleeding and discharge.  Musculoskeletal: No myalgia, arthralgia, joint swelling or pain.  Neurologic: No weakness, numbness, or change in gait.  Psychology: No issues .  Current Meds:  Outpatient Encounter Prescriptions as of 01/19/2015  Medication Sig  . acetaminophen (TYLENOL) 325 MG tablet Take 650 mg by mouth every 6 (six) hours as needed.  Marland Kitchen buPROPion (WELLBUTRIN XL) 150 MG 24 hr tablet TAKE 1 TABLET BY MOUTH ONCE EVERY MORNING  . DULoxetine (CYMBALTA) 60 MG capsule Take 60 mg by mouth daily.  Marland Kitchen ALPRAZolam (XANAX) 0.5 MG tablet Take 0.5 mg by mouth 3 (three) times daily as needed for anxiety.  Marland Kitchen LORATADINE PO Take 10 mg by mouth daily.  . [DISCONTINUED] BIOTIN PO Take by mouth.  . [DISCONTINUED] escitalopram (LEXAPRO) 10 MG tablet Take 10 mg by mouth daily.  . [DISCONTINUED] Multiple Vitamins-Minerals (MULTIVITAMIN PO) Take by mouth.   No facility-administered encounter medications on file as of 01/19/2015.    Allergy:  Allergies  Allergen Reactions  . Amoxicillin     High fever and aches  . Aspirin     Throat gets itchy  . Nsaids Itching    Itchy throat and gums, throat closes up; palpitations     Social Hx:   Social History   Social History  . Marital Status: Single    Spouse Name: N/A  . Number of Children: N/A  . Years of Education: N/A   Occupational History  . Not on file.   Social History Main Topics  . Smoking status: Never Smoker   . Smokeless tobacco: Not on file  . Alcohol Use: 0.6 - 1.2 oz/week    1-2 Standard drinks or equivalent per week     Comment: occas  . Drug Use: No  . Sexual Activity: Not on file   Other Topics Concern  . Not on file   Social History Narrative   [place]   0 children   Exercise-treadmill, weights 1-2 times per week       Past Surgical Hx:  Past Surgical History  Procedure Laterality Date  . Salpingoophorectomy  07/2006    Left for Stage 1A  ovarian cancer  . Appendectomy  08/2006  . Bunionectomy  02/2007  . Cystectomy  07/2008    from right ovary  . Wisdom tooth extraction  2015    Past Medical Hx:  Past Medical History  Diagnosis Date  . Dizziness   . Migraine     with aura around menses  . Anxiety   . Ovarian cancer ([place]) 2008    Stage 1A  . Thyroid disease     Family Hx:  Family History  Problem Relation Age of Onset  . Diabetes Maternal Aunt   . Heart attack Maternal Grandfather   . Heart disease Maternal Grandfather     Heart attack; CAD   . Fibromyalgia Mother   . Thyroid disease Mother   . Blindness Father   . Alcohol abuse Father     Vitals:  Blood pressure 123/73, pulse 94,  temperature 98.4 F (36.9 C), temperature source Oral, resp. rate 20, height 5' (1.524 m), weight 133 lb 3.2 oz (60.419 kg), SpO2 100 %.  Physical Exam: Well-nourished well-developed female in no acute distress.  Neck: Supple, no lymphadenopathy, no family.  Lungs: Clear to auscultation bilaterally.  Cardiovascular: Regular rate and rhythm.  Breasts: deferred  Abdomen: Well-healed vertical midline incision.There is no evidence of any incisional hernias. Abdomen is soft and nontender.  Groins: No lymphadenopathy.  Extremities: No edema.  Pelvic: Normal external female genitalia. The vagina is well epithelialized. The cervix is visualized. There's no visible lesions. Bimanual examination the uterus is freely mobile. It appears to be retroflexed. There is no adnexal masses. Rectal confirms no nodularity.    [name], MD 01/19/2015, 3:10 PM

## 2015-01-22 ENCOUNTER — Telehealth: Payer: Self-pay | Admitting: *Deleted

## 2015-01-22 NOTE — Telephone Encounter (Signed)
Call to patient to schedule NGYN appointment with Nexplanon insertion. Left message to call back.

## 2015-02-08 NOTE — Telephone Encounter (Signed)
Call to patient to offer Nina appointment. Left message to call back.

## 2015-02-12 NOTE — Telephone Encounter (Signed)
Call to patient, left message to call back. Left message we are calling regarding referral to our office, please ler [place] know if she still wants appointment. Can speak to Gay Filler or [place].

## 2015-02-15 NOTE — Telephone Encounter (Signed)
Patient called in on 02-14-15 and scheduled NGYN appointment on 03-02-15 with Dr Talbert [name]. Lavella Hammock, can you send referral information to Dr Talbert [name] for review.

## 2015-02-16 NOTE — Telephone Encounter (Signed)
Dr. Denman George referred this patient to Dr. Sabra Heck. I called and left a message to call back and reschedule her appointment with Dr. Sabra Heck.

## 2015-02-19 NOTE — Telephone Encounter (Signed)
Scheduled for 02/20/15 with Dr. Sabra Heck.

## 2015-02-20 ENCOUNTER — Encounter: Payer: Self-pay | Admitting: Obstetrics & Gynecology

## 2015-02-20 ENCOUNTER — Ambulatory Visit (INDEPENDENT_AMBULATORY_CARE_PROVIDER_SITE_OTHER): Payer: BC Managed Care – PPO | Admitting: Obstetrics & Gynecology

## 2015-02-20 VITALS — BP 108/70 | HR 80 | Resp 16 | Ht 59.5 in | Wt 129.0 lb

## 2015-02-20 DIAGNOSIS — C562 Malignant neoplasm of left ovary: Secondary | ICD-10-CM | POA: Diagnosis not present

## 2015-02-20 DIAGNOSIS — G43909 Migraine, unspecified, not intractable, without status migrainosus: Secondary | ICD-10-CM | POA: Insufficient documentation

## 2015-02-20 DIAGNOSIS — G43109 Migraine with aura, not intractable, without status migrainosus: Secondary | ICD-10-CM

## 2015-02-20 DIAGNOSIS — Z3009 Encounter for other general counseling and advice on contraception: Secondary | ICD-10-CM

## 2015-02-20 DIAGNOSIS — Z30017 Encounter for initial prescription of implantable subdermal contraceptive: Secondary | ICD-10-CM | POA: Diagnosis not present

## 2015-02-20 NOTE — Progress Notes (Signed)
Patient ID: Sharon Holmes, female   DOB: 01/07/1981, 34 y.o.   MRN: [id number]   NEW GYNECOLOGY  VISIT   HPI: 34 y.o. Single Caucasian female [name] for new patient visit and for consultation regarding contraceptive options.  Has seen both Dr. Alycia Rossetti and Dr. Denman George with gyn/onc due to hx of Stage 1A ovarian cancer diagnosed in 2008 after have a 17cm ovarian mass.  Pt has two surgeries around time of diagnosis.  First was for x-lap and removal of ovary, second was for laparoscopy for staging and appendectomy.  Pt does have Ca-125's followed but this value has been as high as in the 90's.  She had a CT scan 10/15 with normal right ovary except for [id number] follicle and significant stool.   Review of records show she's also seen Dr. Cruzita Lederer in consultation regarding possible thyroid disorder.  Pt has Vit D deficiency.  She feels a lot of her fatigue (reason for going to endocrinologist) is related to her depression.  She does have a psychiatrist that follows her as well.    Pt has used the Nuva ring in the past.  She has trouble keeping this in her vagina.  Pt has been on OCPs in the past as well.  Pt does have a hx of migraines with aura so I recommended she stay away from estrogen products.  Pt would like something that would help change in mood that occurs around the time of her cycle.  Pt really not interested in future child bearing but does not desire permanent solution for this.  Nexplanon and IUD use discussed.  I would not recommend Depo Provera for this pt due to depression hx.  Oral progestin use discussed as well.  Placement, risks, benefits, and side effects of the Nexplanon and IUDs discussed.  Information provided on these methods to the patient.    PCP:  Domenick Gong.  Sees yearly.       GYNECOLOGIC HISTORY: Patient's last menstrual period was 02/01/2015. Contraception:  abstinance        OB History    Gravida Para Term Preterm AB TAB SAB Ectopic Multiple Living   0 0 0 0 0 0 0 0 0 0          Patient Active Problem List   Diagnosis Date Noted  . Ovarian cancer ([place]) 11/06/2011    Past Medical History  Diagnosis Date  . Dizziness   . Migraine     with aura around menses  . Anxiety   . Ovarian cancer [facility]) 2008    Stage 1A    Past Surgical History  Procedure Laterality Date  . Salpingoophorectomy  07/2006    Left for Stage 1A ovarian cancer  . Appendectomy  08/2006  . Bunionectomy  02/2007  . Cystectomy  07/2008    from right ovary  . Wisdom tooth extraction  2015    Current Outpatient Prescriptions  Medication Sig Dispense Refill  . acetaminophen (TYLENOL) 325 MG tablet Take 650 mg by mouth every 6 (six) hours as needed.    Marland Kitchen buPROPion (WELLBUTRIN XL) 150 MG 24 hr tablet TAKE 1 TABLET BY MOUTH ONCE EVERY MORNING  1  . Cholecalciferol (VITAMIN D3) 3000 UNITS TABS Take by mouth daily.    . DULoxetine (CYMBALTA) 60 MG capsule Take 60 mg by mouth daily.  5  . Nutritional Supplements (NUTRITIONAL SHAKE PO) Take by mouth daily.     No current facility-administered medications for this visit.     ALLERGIES: Amoxicillin;  Aspirin; and Nsaids  Family History  Problem Relation Age of Onset  . Diabetes Maternal Aunt   . Heart attack Maternal Grandfather   . Heart disease Maternal Grandfather     Heart attack; CAD   . Fibromyalgia Mother   . Thyroid disease Mother   . Blindness Father   . Alcohol abuse Father     Social History   Social History  . Marital Status: Single    Spouse Name: N/A  . Number of Children: N/A  . Years of Education: N/A   Occupational History  . Not on file.   Social History Main Topics  . Smoking status: Never Smoker   . Smokeless tobacco: Never Used  . Alcohol Use: 0.6 - 1.2 oz/week    1-2 Standard drinks or equivalent per week     Comment: occas  . Drug Use: No  . Sexual Activity: Not Currently   Other Topics Concern  . Not on file   Social History Narrative   [place]    0 children   Exercise-treadmill, weights 1-2 times per week       ROS  PHYSICAL EXAMINATION:    BP 108/70 mmHg  Pulse 80  Resp 16  Ht 4' 11.5" (1.511 m)  Wt 129 lb (58.514 kg)  BMI 25.63 kg/m2  LMP 02/01/2015    General appearance: alert, cooperative and appears stated age No other physical exam was performed  Assessment: Desires contraception Mood changes with cycles H/O migraines with aura H/O Stage IA ovarian cancer, 2008, s/p LSO, then one month later laparoscopy for staging and appendix was removed   Plan: Pt is most likely going to proceed with Nexplanon.  She is not currently SA but would like to know insurance coverage.  She will call with onset of cycle for placement.  As not SA, pt aware can be done after day 5 of cycle.   Genetic testing discussed due to personal ovarian cancer hx.  Pt declines for now.  ~30 minutes spent with patient >50% of time was in face to face discussion of above.

## 2015-03-01 ENCOUNTER — Encounter: Payer: Self-pay | Admitting: Obstetrics and Gynecology

## 2015-03-07 ENCOUNTER — Telehealth: Payer: Self-pay | Admitting: Obstetrics & Gynecology

## 2015-03-07 NOTE — Telephone Encounter (Signed)
Patient wants to schedule an appointment for Nexplanon. She states she is waiting to hear from someone about her benefits.

## 2015-03-07 NOTE — Addendum Note (Signed)
Addended by: Michele Mcalpine on: 03/07/2015 11:58 AM   Modules accepted: Orders

## 2015-03-08 NOTE — Telephone Encounter (Signed)
Spoke with patient regarding insurance benefits for 2017 with [employer] for implanon rod. [employer] advised plan has renewed, but advised that I would need to call back in 2017 to verify there has been no change in the plan. Advised pt will call back 03/13/15 to confirm the 2017 benefits

## 2015-03-21 ENCOUNTER — Telehealth: Payer: Self-pay | Admitting: Obstetrics & Gynecology

## 2015-03-21 NOTE — Telephone Encounter (Signed)
Called patient to discuss benefits for a procedure. Left Voicemail requesting a call. °

## 2015-03-30 ENCOUNTER — Encounter: Payer: Self-pay | Admitting: Obstetrics and Gynecology

## 2015-03-30 ENCOUNTER — Ambulatory Visit (INDEPENDENT_AMBULATORY_CARE_PROVIDER_SITE_OTHER): Payer: BC Managed Care – PPO | Admitting: Obstetrics and Gynecology

## 2015-03-30 VITALS — BP 102/70 | HR 92 | Resp 16 | Wt 131.0 lb

## 2015-03-30 DIAGNOSIS — R350 Frequency of micturition: Secondary | ICD-10-CM

## 2015-03-30 DIAGNOSIS — Z7251 High risk heterosexual behavior: Secondary | ICD-10-CM | POA: Diagnosis not present

## 2015-03-30 DIAGNOSIS — N762 Acute vulvitis: Secondary | ICD-10-CM | POA: Diagnosis not present

## 2015-03-30 DIAGNOSIS — R300 Dysuria: Secondary | ICD-10-CM

## 2015-03-30 DIAGNOSIS — N949 Unspecified condition associated with female genital organs and menstrual cycle: Secondary | ICD-10-CM

## 2015-03-30 DIAGNOSIS — R3915 Urgency of urination: Secondary | ICD-10-CM | POA: Diagnosis not present

## 2015-03-30 DIAGNOSIS — Z8543 Personal history of malignant neoplasm of ovary: Secondary | ICD-10-CM

## 2015-03-30 DIAGNOSIS — Z113 Encounter for screening for infections with a predominantly sexual mode of transmission: Secondary | ICD-10-CM

## 2015-03-30 LAB — POCT URINALYSIS DIPSTICK
BILIRUBIN UA: NEGATIVE
Glucose, UA: NEGATIVE
KETONES UA: NEGATIVE
NITRITE UA: NEGATIVE
PH UA: 6.5
Protein, UA: NEGATIVE
Urobilinogen, UA: NEGATIVE

## 2015-03-30 LAB — POCT URINE PREGNANCY: Preg Test, Ur: NEGATIVE

## 2015-03-30 MED ORDER — SULFAMETHOXAZOLE-TRIMETHOPRIM 800-160 MG PO TABS: 1.0000 | ORAL_TABLET | Freq: Two times a day (BID) | ORAL | Status: DC

## 2015-03-30 MED ORDER — BETAMETHASONE VALERATE 0.1 % EX OINT: TOPICAL_OINTMENT | CUTANEOUS | Status: DC

## 2015-03-30 NOTE — Progress Notes (Addendum)
Patient ID: Sharon Holmes, female   DOB: 1980/03/26, 35 y.o.   MRN: [id number] GYNECOLOGY  VISIT   HPI: 35 y.o.   Single  Caucasian  female   [name] with Patient's last menstrual period was 03/04/2015.   here c/o dysuria X10 days. She c/o a constant vulvar burning, it gets better as the day goes on. She also c/o dysuria, feels internal and external. She does c/o frequency and urgency. She had sex prior to the symptoms coming on, symptoms started the next day. Intercourse was uncomfortable, this is a new partner. Hurt more with a condom, felt better without it.  Very mild itching, mostly burning. She tried Azo last week, helped some. Not taking it any more. She also c/o a 1 day h/o lower abdominal cramping. No bleeding, cycle is due next week. No fevers, no bowel symptoms.  GYNECOLOGIC HISTORY: Patient's last menstrual period was 03/04/2015. Contraception:Condoms Menopausal hormone therapy: None        OB History    Gravida Para Term Preterm AB TAB SAB Ectopic Multiple Living   0 0 0 0 0 0 0 0 0 0          Patient Active Problem List   Diagnosis Date Noted  . Migraines 02/20/2015  . Ovarian cancer ([place]) 11/06/2011    Past Medical History  Diagnosis Date  . Dizziness   . Migraine     with aura around menses  . Anxiety   . Ovarian cancer [facility]) 2008    Stage 1A    Past Surgical History  Procedure Laterality Date  . Salpingoophorectomy  07/2006    Left for Stage 1A ovarian cancer  . Appendectomy  08/2006  . Bunionectomy  02/2007  . Cystectomy  07/2008    from right ovary  . Wisdom tooth extraction  2015    Current Outpatient Prescriptions  Medication Sig Dispense Refill  . acetaminophen (TYLENOL) 325 MG tablet Take 650 mg by mouth every 6 (six) hours as needed.    Marland Kitchen buPROPion (WELLBUTRIN XL) 150 MG 24 hr tablet TAKE 1 TABLET BY MOUTH ONCE EVERY MORNING  1  . Cholecalciferol (VITAMIN D3) 3000 UNITS TABS Take by mouth daily.    . DULoxetine (CYMBALTA) 60 MG capsule Take 60 mg  by mouth daily.  5  . Nutritional Supplements (NUTRITIONAL SHAKE PO) Take by mouth daily.     No current facility-administered medications for this visit.     ALLERGIES: Amoxicillin; Aspirin; and Nsaids  Family History  Problem Relation Age of Onset  . Diabetes Maternal Aunt   . Heart attack Maternal Grandfather   . Heart disease Maternal Grandfather     Heart attack; CAD   . Fibromyalgia Mother   . Thyroid disease Mother   . Blindness Father   . Alcohol abuse Father     Social History   Social History  . Marital Status: Single    Spouse Name: N/A  . Number of Children: N/A  . Years of Education: N/A   Occupational History  . Not on file.   Social History Main Topics  . Smoking status: Never Smoker   . Smokeless tobacco: Never Used  . Alcohol Use: 0.6 - 1.2 oz/week    1-2 Standard drinks or equivalent per week     Comment: occas  . Drug Use: No  . Sexual Activity: Not Currently   Other Topics Concern  . Not on file   Social History Narrative   [place]  [employer]   0 children   Exercise-treadmill, weights 1-2 times per week       Review of Systems  Constitutional: Negative.   HENT: Negative.   Eyes: Negative.   Respiratory: Negative.   Cardiovascular: Negative.   Gastrointestinal:       Bloating   Genitourinary: Positive for dysuria.       Painful intercourse  Vaginal burning and itching  Musculoskeletal: Negative.   Skin: Negative.   Neurological: Negative.   Endo/Heme/Allergies: Negative.   Psychiatric/Behavioral: Negative.     PHYSICAL EXAMINATION:    BP 102/70 mmHg  Pulse 92  Resp 16  Wt 131 lb (59.421 kg)  LMP 03/04/2015    General appearance: alert, cooperative and appears stated age Abdomen: soft, non-tender; bowel sounds normal; no masses,  no organomegaly CVA: not tender  Pelvic: External genitalia:  no lesions, generalized erythema              Urethra:  normal appearing urethra with no masses,  tenderness or lesions              Bartholins and Skenes: normal                 Vagina: normal appearing vagina with normal color and discharge, no lesions, brown d/c noted              Cervix: no lesions              Bimanual Exam:  Uterus:  normal size, contour, position, consistency, mobility, non-tender              Adnexa: fullness and tenderness in the right adnexa. No fullness or tenderness on the left              Rectovaginal: Yes.  .  Confirms.              Anus:  normal sphincter tone, no lesions  Chaperone was present for exam.  AS SESSMENT Vulvitis, negative vaginal slides Dysuria, urgency and frequency Screening STD, declines blood work Right adnexal fullness and tenderness H/O ovarian cancer and prior LSO  Contraception, is planning to return for the nexplanon with her cycle (brown spotting on exam)    PLAN UPT negative Urine for ua, c&s Vaginitis probe Genprobe Treat vulvitis with steroid ointment, reviewed vulvar skin care Bactrim for suspected UTI Return for nexplanon insertion Return for a gyn ultrasound (discussed the option of rechecking her exam at her nexplanon insertion first)   An After Visit Summary was printed and given to the patient.  Addendum: Wet prep: no clue, no trich, ++ wbc KOH: no yeast PH: 4

## 2015-03-31 LAB — URINALYSIS, MICROSCOPIC ONLY
BACTERIA UA: NONE SEEN [HPF]
CASTS: NONE SEEN [LPF]
CRYSTALS: NONE SEEN [HPF]
Squamous Epithelial / LPF: NONE SEEN [HPF] (ref <=5)
YEAST: NONE SEEN [HPF]

## 2015-03-31 LAB — WET PREP BY MOLECULAR PROBE
CANDIDA SPECIES: NEGATIVE
Gardnerella vaginalis: NEGATIVE
TRICHOMONAS VAG: NEGATIVE

## 2015-04-02 ENCOUNTER — Telehealth: Payer: Self-pay | Admitting: *Deleted

## 2015-04-02 ENCOUNTER — Telehealth: Payer: Self-pay | Admitting: Obstetrics and Gynecology

## 2015-04-02 LAB — GC/CHLAMYDIA PROBE AMP
CT PROBE, AMP APTIMA: NOT DETECTED
GC Probe RNA: NOT DETECTED

## 2015-04-02 LAB — URINE CULTURE: Colony Count: 75000

## 2015-04-02 NOTE — Telephone Encounter (Signed)
04-02-15 [place] in regards to lab results -eh

## 2015-04-02 NOTE — Telephone Encounter (Signed)
-----   Message from Salvadore Dom, MD sent at 04/02/2015  9:11 AM EST ----- Please inform of negative vaginitis panel, urine is cw a UTI, sensitivities are pending. Please see if she is feeling better. Genprobe pending

## 2015-04-02 NOTE — Telephone Encounter (Signed)
Patient returned call

## 2015-04-02 NOTE — Telephone Encounter (Signed)
Spoke with pt regarding benefit for ultrasound. Patient understood and agreeable. Patient ready to schedule. Patient scheduled 04/10/15 with Dr Talbert [name]. Pt aware of arrival date and time. Pt aware of 72 hours cancellation policy with [id number] fee. No further questions. Ok to close

## 2015-04-02 NOTE — Telephone Encounter (Signed)
Called patient to discuss benefits for a procedure. Left Voicemail requesting a call. °

## 2015-04-03 NOTE — Telephone Encounter (Signed)
I left another message with patient -eh

## 2015-04-04 NOTE — Telephone Encounter (Signed)
Patient returned call

## 2015-04-04 NOTE — Telephone Encounter (Signed)
Left message to call -eh 

## 2015-04-10 ENCOUNTER — Ambulatory Visit (INDEPENDENT_AMBULATORY_CARE_PROVIDER_SITE_OTHER): Payer: BC Managed Care – PPO

## 2015-04-10 ENCOUNTER — Ambulatory Visit (INDEPENDENT_AMBULATORY_CARE_PROVIDER_SITE_OTHER): Payer: BC Managed Care – PPO | Admitting: Obstetrics and Gynecology

## 2015-04-10 ENCOUNTER — Encounter: Payer: Self-pay | Admitting: Obstetrics and Gynecology

## 2015-04-10 VITALS — BP 108/70 | HR 88 | Resp 14 | Wt 130.0 lb

## 2015-04-10 DIAGNOSIS — N949 Unspecified condition associated with female genital organs and menstrual cycle: Secondary | ICD-10-CM

## 2015-04-10 DIAGNOSIS — N83201 Unspecified ovarian cyst, right side: Secondary | ICD-10-CM | POA: Diagnosis not present

## 2015-04-10 NOTE — Progress Notes (Signed)
GYNECOLOGY  VISIT   HPI: 35 y.o.   Single  Caucasian  female   [name] with Patient's last menstrual period was 04/02/2015.   here for  Pelvic U/S   GYNECOLOGIC HISTORY: Patient's last menstrual period was 04/02/2015. Contraception:None Menopausal hormone therapy: None        OB History    Gravida Para Term Preterm AB TAB SAB Ectopic Multiple Living   0 0 0 0 0 0 0 0 0 0          Patient Active Problem List   Diagnosis Date Noted  . Migraines 02/20/2015  . Ovarian cancer ([place]) 11/06/2011    Past Medical History  Diagnosis Date  . Dizziness   . Migraine     with aura around menses  . Anxiety   . Ovarian cancer [facility]) 2008    Stage 1A    Past Surgical History  Procedure Laterality Date  . Salpingoophorectomy  07/2006    Left for Stage 1A ovarian cancer  . Appendectomy  08/2006  . Bunionectomy  02/2007  . Cystectomy  07/2008    from right ovary  . Wisdom tooth extraction  2015    Current Outpatient Prescriptions  Medication Sig Dispense Refill  . acetaminophen (TYLENOL) 325 MG tablet Take 650 mg by mouth every 6 (six) hours as needed.    Marland Kitchen buPROPion (WELLBUTRIN XL) 150 MG 24 hr tablet TAKE 1 TABLET BY MOUTH ONCE EVERY MORNING  1  . Cholecalciferol (VITAMIN D3) 3000 UNITS TABS Take by mouth daily.    . DULoxetine (CYMBALTA) 60 MG capsule Take 60 mg by mouth daily.  5  . Nutritional Supplements (NUTRITIONAL SHAKE PO) Take by mouth daily.    . betamethasone valerate ointment (VALISONE) 0.1 % Apply a pea sized amount BID x 1-2 weeks 30 g 0   No current facility-administered medications for this visit.     ALLERGIES: Amoxicillin; Aspirin; and Nsaids  Family History  Problem Relation Age of Onset  . Diabetes Maternal Aunt   . Heart attack Maternal Grandfather   . Heart disease Maternal Grandfather     Heart attack; CAD   . Fibromyalgia Mother   . Thyroid disease Mother   . Blindness Father   . Alcohol abuse Father     Social History   Social History    . Marital Status: Single    Spouse Name: N/A  . Number of Children: N/A  . Years of Education: N/A   Occupational History  . Not on file.   Social History Main Topics  . Smoking status: Never Smoker   . Smokeless tobacco: Never Used  . Alcohol Use: 0.6 - 1.2 oz/week    1-2 Standard drinks or equivalent per week     Comment: occas  . Drug Use: No  . Sexual Activity: Not Currently   Other Topics Concern  . Not on file   Social History Narrative   [place]   0 children   Exercise-treadmill, weights 1-2 times per week       Review of Systems  Constitutional: Negative.   HENT: Negative.   Eyes: Negative.   Respiratory: Negative.   Cardiovascular: Negative.   Gastrointestinal: Negative.   Genitourinary: Negative.   Musculoskeletal: Negative.   Skin: Negative.   Neurological: Negative.   Endo/Heme/Allergies: Negative.   Psychiatric/Behavioral: Negative.     PHYSICAL EXAMINATION:    BP 108/70 mmHg  Pulse 88  Resp 14  LMP 04/02/2015  General appearance: alert, cooperative and appears stated age   ASSESSMENT Complex right ovarian cyst in a patient with a h/o stage 1A right ovarian cancer. The cyst appears benign, suspect hemorrhagic CL or endometrioma (She reports a h/o endo), less likely dermoid    PLAN Normally I would repeat the ultrasound in 8 weeks, but given her h/o ovarian cancer, I will refer her back to Dr Denman George to follow her.  CA 125 with Dr Denman George, discussed that a benign cyst could elevate her CA 125   An After Visit Summary was printed and given to the patient.  10 minutes face to face time of which over 50% was spent in counseling.

## 2015-04-10 NOTE — Telephone Encounter (Signed)
Patient aware of lab results -eh

## 2015-04-10 NOTE — Progress Notes (Signed)
Patient is scheduled to see Dr. Denman George on 04/23/15 at 1400. Patient agreeable to date/time/location. Declined earlier appointment due to work schedule.

## 2015-04-23 ENCOUNTER — Other Ambulatory Visit: Payer: BC Managed Care – PPO

## 2015-04-23 ENCOUNTER — Encounter: Payer: Self-pay | Admitting: Gynecologic Oncology

## 2015-04-23 ENCOUNTER — Ambulatory Visit: Payer: BC Managed Care – PPO | Attending: Gynecologic Oncology | Admitting: Gynecologic Oncology

## 2015-04-23 VITALS — BP 122/72 | HR 102 | Temp 98.5°F | Resp 17 | Wt 130.8 lb

## 2015-04-23 DIAGNOSIS — G43909 Migraine, unspecified, not intractable, without status migrainosus: Secondary | ICD-10-CM | POA: Diagnosis not present

## 2015-04-23 DIAGNOSIS — R971 Elevated cancer antigen 125 [CA 125]: Secondary | ICD-10-CM | POA: Insufficient documentation

## 2015-04-23 DIAGNOSIS — N736 Female pelvic peritoneal adhesions (postinfective): Secondary | ICD-10-CM | POA: Diagnosis not present

## 2015-04-23 DIAGNOSIS — Z811 Family history of alcohol abuse and dependence: Secondary | ICD-10-CM | POA: Insufficient documentation

## 2015-04-23 DIAGNOSIS — Z8543 Personal history of malignant neoplasm of ovary: Secondary | ICD-10-CM | POA: Insufficient documentation

## 2015-04-23 DIAGNOSIS — Z8249 Family history of ischemic heart disease and other diseases of the circulatory system: Secondary | ICD-10-CM | POA: Diagnosis not present

## 2015-04-23 DIAGNOSIS — R42 Dizziness and giddiness: Secondary | ICD-10-CM | POA: Insufficient documentation

## 2015-04-23 DIAGNOSIS — F329 Major depressive disorder, single episode, unspecified: Secondary | ICD-10-CM | POA: Diagnosis not present

## 2015-04-23 DIAGNOSIS — F419 Anxiety disorder, unspecified: Secondary | ICD-10-CM | POA: Insufficient documentation

## 2015-04-23 DIAGNOSIS — N83201 Unspecified ovarian cyst, right side: Secondary | ICD-10-CM | POA: Diagnosis not present

## 2015-04-23 DIAGNOSIS — Z833 Family history of diabetes mellitus: Secondary | ICD-10-CM | POA: Insufficient documentation

## 2015-04-23 DIAGNOSIS — C562 Malignant neoplasm of left ovary: Secondary | ICD-10-CM

## 2015-04-23 NOTE — Patient Instructions (Signed)
Plan for a repeat ultrasound with Dr. Gentry Fitz office in one month.  We will obtain a CA 125 today and call you with the results.  If everything is normal, plan to follow up with Dr. Denman George in November 2017.  Please call in the summer of 2017 to schedule your appt.

## 2015-04-23 NOTE — Progress Notes (Signed)
Follow-up Note: Gyn-Onc  Sharon Holmes 35 y.o. female  CC:  Chief Complaint  Patient presents with  . Cyst    New cyst , follow up appointment    Assessment/Plan: 35 year old with a stage IA unknown histology ovarian carcinoma diagnosed and treated in 2008. She has intermittently had elevated CA 125 with normal imaging.   New cystic mass on right ovary, likely benign. Recommend re-evaluation with repeat US at Dr Gentry Fitz office 6-8 weeks after initial (beteen March 14th-31st). Will draw CA 125 today. If elevated, this is not specific for malignancy, however, if normal, is reassuring. If US shows stable or decreasing in size, can continue to follow expectantly. If increasing in size on repeat US, recommend laparoscopic evaluation and possible ovarian cystectomy.  Her pap from 8/14 revealed normal cytology and negative for HRHPV. Based on current ASCCP and ACOG recommendations, she requires repeat cervical cytology with HPV screening in 5 years (2019).  I will see her in November 2017 for ongoing surveillance.  HPI:   35 year old gravida 0 (DES grand daughter) in 2008 began feeling some fatigue bloating and was having some bleeding every week. She was told her that she might have polycystic ovarian syndrome. She then developed some sharp left-sided shoulder pain went to her local emergency room where an MRI was performed it revealed a 17 cm ovarian mass. She underwent exploratory laparotomy left salpingo-oophorectomy that revealed a this early stage ovarian cancer in 2008. She does not know the histology and we do not have a record of her final pathology from that surgery. After that she was referred to Dr. Susa Day a gynecologic oncologist in [place]. She underwent complete staging at that time (that pathology is availalble) which was completely negative (including appendectomy) and she was felt to have a stage IA ovarian cancer was dispositioned to close followup. Her CA 125 was elevated  at the time of her diagnosis and has been followed closely. Her CA-125 has been elevated from time to time and most recently in October 2011 they were elevated at 74. In 2010 she had an evaluation for a right ovarian mass and underwent a right ovarian cystectomy that revealed endometriosis. She was also diagnosed with left-sided pelvic adhesions. October 23, 2011 CA 125  was elevated at 55.6. Her last Pap smear was normal in 11/06/11.  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 She does have some breast tenderness week before her cycles.She occasionally has some ovarian discomfort she believes corresponds to ovulation in the right lower quadrant. She has mild cramping with her menstrual cycles. She has a brother pain or pressure. There are no new medical problems and her family. Her mother had Graves' disease and had a thyroidectomy and is currently on Synthroid. She also has fibromyalgia.   She has declined genetic screening. She had a history of anxiety and depression and she has seen a psychiatrist in the past year who prescribed Lexapro. This has improved her symptoms and she noticed weight gain with this (though she had experienced weight loss before this treatment began).  She had an elevated CA 125 when I saw her in 2015, (90's) however, a CT scan in October, 2015 showed no evidence of recurrence. It is normal for her CA 125 values to wax and wane. A CA 125 1 month ago  was 75.   Interval History:  She saw Dr Sumner Boast on 04/10/15 for a symptomatic UTI. During pelvic exam a mass was appreciated on bimanual exam. A TVUS was ordered and revealed a surgically absent left ovary. The right ovary contained a 2.8 x 1.9 cm echogenic mass with no internal blood flow, homogeneous throughout. It was felt to represent either a hemorrhagic  cyst versus dermoid versus an endometrioma. No free fluid was seen. She is otherwise asymptomatic.  Review of Systems:   Constitutional: Feeling well Skin: No rash, sores, jaundice, itching, or dryness.  Cardiovascular: No chest pain, shortness of breath, or edema  Pulmonary: No cough or wheeze.  Gastro Intestinal: No nausea, vomiting, constipation, or diarrhea reported. No bright red blood per rectum or change in bowel movement.  Genitourinary: No frequency, urgency, or dysuria.  Denies vaginal bleeding and discharge.  Musculoskeletal: No myalgia, arthralgia, joint swelling or pain.  Neurologic: No weakness, numbness, or change in gait.  Psychology: No issues .  Current Meds:  Outpatient Encounter Prescriptions as of 04/23/2015  Medication Sig  . acetaminophen (TYLENOL) 325 MG tablet Take 650 mg by mouth every 6 (six) hours as needed.  Marland Kitchen buPROPion (WELLBUTRIN XL) 150 MG 24 hr tablet TAKE 1 TABLET BY MOUTH ONCE EVERY MORNING  . Cholecalciferol (VITAMIN D3) 3000 UNITS TABS Take by mouth daily.  . DULoxetine (CYMBALTA) 60 MG capsule Take 60 mg by mouth daily.  . Nutritional Supplements (NUTRITIONAL SHAKE PO) Take by mouth daily. Reported on 04/23/2015  . [DISCONTINUED] betamethasone valerate ointment (VALISONE) 0.1 % Apply a pea sized amount BID x 1-2 weeks   No facility-administered encounter medications on file as of 04/23/2015.    Allergy:  Allergies  Allergen Reactions  . Amoxicillin     High fever and aches  . Aspirin     Throat gets itchy  . Nsaids Itching    Itchy throat and gums, throat closes up; palpitations     Social Hx:   Social History   Social History  . Marital Status: Single    Spouse Name: N/A  . Number of Children: N/A  . Years of Education: N/A   Occupational History  . Not on file.   Social History Main Topics  . Smoking status: Never Smoker   . Smokeless tobacco: Never Used  . Alcohol Use: 0.6 - 1.2 oz/week    1-2 Standard drinks or equivalent  per week     Comment: occas  . Drug Use: No  . Sexual Activity: Not Currently   Other Topics Concern  . Not on file   Social History Narrative   Oriental   0 children   Exercise-treadmill, weights 1-2 times per week       Past Surgical Hx:  Past Surgical History  Procedure Laterality Date  . Salpingoophorectomy  07/2006    Left for Stage 1A ovarian cancer  . Appendectomy  08/2006  . Bunionectomy  02/2007  . Cystectomy  07/2008    from right ovary  . Wisdom tooth extraction  2015    Past Medical Hx:  Past Medical History  Diagnosis Date  . Dizziness   . Migraine     with aura around menses  . Anxiety   . Ovarian cancer ([place]) 2008    Stage 1A    Family Hx:  Family History  Problem Relation Age of Onset  . Diabetes Maternal Aunt   . Heart attack Maternal Grandfather   .  Heart disease Maternal Grandfather     Heart attack; CAD   . Fibromyalgia Mother   . Thyroid disease Mother   . Blindness Father   . Alcohol abuse Father     Vitals:  Blood pressure 122/72, pulse 102, temperature 98.5 F (36.9 C), temperature source Oral, resp. rate 17, weight 130 lb 12.8 oz (59.33 kg), last menstrual period 04/02/2015, SpO2 98 %.  Physical Exam: Well-nourished well-developed female in no acute distress.  Neck: Supple, no lymphadenopathy, no family.  Lungs: Clear to auscultation bilaterally.  Cardiovascular: Regular rate and rhythm.  Breasts: deferred  Abdomen: [place]. Well-healed vertical midline incision.There is no evidence of any incisional hernias. Abdomen is soft and nontender.  Groins: No lymphadenopathy.  Extremities: No edema.  Pelvic: Normal external female genitalia. The vagina is well epithelialized. The cervix is visualized. There's no visible lesions. Bimanual examination the uterus is freely mobile. It appears to be retroflexed. There is no adnexal masses. Rectal confirms no nodularity.    [name], MD 04/23/2015, 3:22 PM

## 2015-04-24 LAB — CA 125: Cancer Antigen (CA) 125: 117 U/mL — ABNORMAL HIGH (ref 0.0–38.1)

## 2015-04-25 ENCOUNTER — Telehealth: Payer: Self-pay | Admitting: Gynecologic Oncology

## 2015-04-25 NOTE — Telephone Encounter (Signed)
Message left for the patient stating the below from Dr. Denman George:  Elevated ca 125. However, patient has history of intermittently elevated CA 125.    Will check [place] in end of March as planned. If abnormal, will plan for laparoscopy.    [name], MD

## 2015-05-21 ENCOUNTER — Telehealth: Payer: Self-pay | Admitting: Emergency Medicine

## 2015-05-21 DIAGNOSIS — C562 Malignant neoplasm of left ovary: Secondary | ICD-10-CM

## 2015-05-21 DIAGNOSIS — N83201 Unspecified ovarian cyst, right side: Secondary | ICD-10-CM

## 2015-05-21 NOTE — Telephone Encounter (Signed)
Advised by Thayer Ohm to call patient and schedule follow up per Dr. Denman George for Pelvic ultrasound with Dr. Talbert [name].  Order placed for pre-certification. Message left to return call to [facility] at [phone] to schedule follow up Pelvic ultrasound .

## 2015-05-22 NOTE — Telephone Encounter (Signed)
Spoke with pt regarding benefit for ultrasound. Patient understood and agreeable. Patient ready to schedule. Patient scheduled 05/31/15 with Dr Talbert [name]. Pt aware of arrival date and time. Pt aware of 72 hours cancellation policy with [id number] fee. No further questions. Ok to close

## 2015-05-30 ENCOUNTER — Telehealth: Payer: Self-pay | Admitting: Obstetrics and Gynecology

## 2015-05-30 NOTE — Telephone Encounter (Signed)
Spoke with patinet. She feels she will start her cycle at any minute and wants to plan for Nexplanon and Pelvic ultrasound . Advised patient that Dr. Talbert [name] is aware that she wants to have both completed, but depends on her cycle. If she has not started her cycle, may not have placement but she can Dr. Talbert [name] can discuss this.  Patient agreeable.  Will keep appointment as scheduled tomorrow.  Routing to provider for final review. Patient agreeable to disposition. Will close encounter.

## 2015-05-30 NOTE — Telephone Encounter (Signed)
Patient calling to see if she can have an Implanon inserted at her ultrasound on 05/31/15. She said, "I can feel it. My cycle is going to start any minute. I'd like to get this all done at one time if possible."

## 2015-05-31 ENCOUNTER — Ambulatory Visit (INDEPENDENT_AMBULATORY_CARE_PROVIDER_SITE_OTHER): Payer: BC Managed Care – PPO | Admitting: Obstetrics and Gynecology

## 2015-05-31 ENCOUNTER — Encounter: Payer: Self-pay | Admitting: Obstetrics and Gynecology

## 2015-05-31 ENCOUNTER — Telehealth: Payer: Self-pay | Admitting: Gynecologic Oncology

## 2015-05-31 ENCOUNTER — Ambulatory Visit (INDEPENDENT_AMBULATORY_CARE_PROVIDER_SITE_OTHER): Payer: BC Managed Care – PPO

## 2015-05-31 VITALS — BP 100/70 | HR 80 | Resp 14 | Wt 131.0 lb

## 2015-05-31 DIAGNOSIS — Z3049 Encounter for surveillance of other contraceptives: Secondary | ICD-10-CM

## 2015-05-31 DIAGNOSIS — N83201 Unspecified ovarian cyst, right side: Secondary | ICD-10-CM

## 2015-05-31 DIAGNOSIS — Z8543 Personal history of malignant neoplasm of ovary: Secondary | ICD-10-CM

## 2015-05-31 DIAGNOSIS — Z01812 Encounter for preprocedural laboratory examination: Secondary | ICD-10-CM

## 2015-05-31 DIAGNOSIS — R971 Elevated cancer antigen 125 [CA 125]: Secondary | ICD-10-CM

## 2015-05-31 DIAGNOSIS — C562 Malignant neoplasm of left ovary: Secondary | ICD-10-CM | POA: Diagnosis not present

## 2015-05-31 DIAGNOSIS — C569 Malignant neoplasm of unspecified ovary: Secondary | ICD-10-CM

## 2015-05-31 LAB — CA 125: CA 125: 658 U/mL — ABNORMAL HIGH (ref <35)

## 2015-05-31 LAB — POCT URINE PREGNANCY: PREG TEST UR: NEGATIVE

## 2015-05-31 NOTE — Telephone Encounter (Signed)
Left message.  We will need to see her in the office to discuss her elevated CA 125. She will need imaging with a CT abdo/pelvis which we have ordered and will inform her of the timing of this and her appointment with me.  I will likely consider a diagnostic laparoscopy if her CT is negative.  [name], MD

## 2015-05-31 NOTE — Progress Notes (Signed)
GYNECOLOGY  VISIT   HPI: 35 y.o.   Single  Caucasian  female   [name] with Patient's last menstrual period was 05/30/2015.   here for a pelvic U/S and Nexplanon insertion. The patient has a h/o left ovarian cancer, stage 1A in 2008. She was diagnosed with a complex benign appearing right ovarian cyst 2 months ago. She saw Dr Denman George, had an elevated CA 125 of 117 and is here for a f/u ultrasound today. She has had intermittently elevated CA 125's since her surgery in 2008. She also desires nexplanon insertion today.  She does not plan on having children in the future, but would like to retain her ovary if possible.   GYNECOLOGIC HISTORY: Patient's last menstrual period was 05/30/2015. Contraception:None Menopausal hormone therapy: None         OB History    Gravida Para Term Preterm AB TAB SAB Ectopic Multiple Living   0 0 0 0 0 0 0 0 0 0          Patient Active Problem List   Diagnosis Date Noted  . Migraines 02/20/2015  . Ovarian cancer ([place]) 11/06/2011    Past Medical History  Diagnosis Date  . Dizziness   . Migraine     with aura around menses  . Anxiety   . Ovarian cancer [facility]) 2008    Stage 1A    Past Surgical History  Procedure Laterality Date  . Salpingoophorectomy  07/2006    Left for Stage 1A ovarian cancer  . Appendectomy  08/2006  . Bunionectomy  02/2007  . Cystectomy  07/2008    from right ovary  . Wisdom tooth extraction  2015    Current Outpatient Prescriptions  Medication Sig Dispense Refill  . acetaminophen (TYLENOL) 325 MG tablet Take 650 mg by mouth every 6 (six) hours as needed.    Marland Kitchen buPROPion (WELLBUTRIN XL) 150 MG 24 hr tablet TAKE 1 TABLET BY MOUTH ONCE EVERY MORNING  1  . Cholecalciferol (VITAMIN D3) 3000 UNITS TABS Take by mouth daily.    . DULoxetine (CYMBALTA) 60 MG capsule Take 60 mg by mouth daily.  5  . Nutritional Supplements (NUTRITIONAL SHAKE PO) Take by mouth daily. Reported on 04/23/2015     No current facility-administered  medications for this visit.     ALLERGIES: Amoxicillin; Aspirin; and Nsaids  Family History  Problem Relation Age of Onset  . Diabetes Maternal Aunt   . Heart attack Maternal Grandfather   . Heart disease Maternal Grandfather     Heart attack; CAD   . Fibromyalgia Mother   . Thyroid disease Mother   . Blindness Father   . Alcohol abuse Father     Social History   Social History  . Marital Status: Single    Spouse Name: N/A  . Number of Children: N/A  . Years of Education: N/A   Occupational History  . Not on file.   Social History Main Topics  . Smoking status: Never Smoker   . Smokeless tobacco: Never Used  . Alcohol Use: 0.6 - 1.2 oz/week    1-2 Standard drinks or equivalent per week     Comment: occas  . Drug Use: No  . Sexual Activity: Not Currently   Other Topics Concern  . Not on file   Social History Narrative   [place]   0 children   Exercise-treadmill, weights 1-2 times per week       Review  of Systems  Constitutional: Negative.   HENT: Negative.   Eyes: Negative.   Respiratory: Negative.   Cardiovascular: Negative.   Gastrointestinal: Negative.   Genitourinary: Negative.   Musculoskeletal: Negative.   Skin: Negative.   Neurological: Negative.   Endo/Heme/Allergies: Negative.   Psychiatric/Behavioral: Negative.     PHYSICAL EXAMINATION:    BP 100/70 mmHg  Pulse 80  Resp 14  Wt 131 lb (59.421 kg)  LMP 05/30/2015    General appearance: alert, cooperative and appears stated age  Risks of nexplanon insertion was reviewed with the patient and a consent was signed.  The patient was placed in the supine position with her left arm bent at the elbow. A ruler was used to determine the proper insertion site of the nexplanon. The area was cleansed with betadine and injected with 1% lidocaine along the path where the nexplanon would be placed. The nexplanon device was inserted in the usual fashion without  difficulty. Slight oozing from the insertion site was stopped with pressure. The device was palpated in place.  The patients arm was cleansed of betadine and a steri strip was placed over the incision. A gauze was wrapped around her arm.  She tolerated the procedure well  Instructions for care were discussed.   Ultrasound as above revealed a normal sized uterus, absent left ovary and a persistent complex right ovarian cyst. No significant change in size, 2.4 x 2.1 cm. No flow within the cyst.    ASSESSMENT Persistent, stable complex right ovarian cyst and an elevated CA 125 in a patient with a h/o left ovarian cancer in 2008 Contraception, desires nexplanon. She is aware it is possible she will have her ovary removed, she would like to proceed with insertion today.    PLAN Repeat CA 125 today Will refer back to Dr Denman George for likely surgery, the patient would like to retain her ovary if possible   An After Visit Summary was printed and given to the patient.  In addition to nexplanon insertion, at least 15 minutes was spent face to face, 100% in counseling

## 2015-06-07 ENCOUNTER — Encounter (HOSPITAL_COMMUNITY): Payer: Self-pay

## 2015-06-07 ENCOUNTER — Ambulatory Visit (HOSPITAL_COMMUNITY)
Admission: RE | Admit: 2015-06-07 | Discharge: 2015-06-07 | Disposition: A | Discharge status: Home / Self Care | Payer: BC Managed Care – PPO | Source: Ambulatory Visit | Attending: Gynecologic Oncology | Admitting: Gynecologic Oncology

## 2015-06-07 DIAGNOSIS — N83201 Unspecified ovarian cyst, right side: Secondary | ICD-10-CM | POA: Insufficient documentation

## 2015-06-07 DIAGNOSIS — C569 Malignant neoplasm of unspecified ovary: Secondary | ICD-10-CM | POA: Insufficient documentation

## 2015-06-07 DIAGNOSIS — R971 Elevated cancer antigen 125 [CA 125]: Secondary | ICD-10-CM | POA: Diagnosis present

## 2015-06-07 MED ORDER — IOPAMIDOL (ISOVUE-300) INJECTION 61%
100.0000 mL | Freq: Once | INTRAVENOUS | Status: AC | PRN
  Administered 2015-06-07: 100 mL via INTRAVENOUS

## 2015-06-11 ENCOUNTER — Ambulatory Visit: Payer: BC Managed Care – PPO | Attending: Gynecologic Oncology | Admitting: Gynecologic Oncology

## 2015-06-11 ENCOUNTER — Other Ambulatory Visit (HOSPITAL_BASED_OUTPATIENT_CLINIC_OR_DEPARTMENT_OTHER): Payer: BC Managed Care – PPO

## 2015-06-11 ENCOUNTER — Encounter: Payer: Self-pay | Admitting: Gynecologic Oncology

## 2015-06-11 VITALS — BP 123/81 | HR 81 | Temp 97.3°F | Resp 16 | Ht 59.5 in | Wt 128.8 lb

## 2015-06-11 DIAGNOSIS — C569 Malignant neoplasm of unspecified ovary: Secondary | ICD-10-CM

## 2015-06-11 DIAGNOSIS — N390 Urinary tract infection, site not specified: Secondary | ICD-10-CM | POA: Insufficient documentation

## 2015-06-11 DIAGNOSIS — N83201 Unspecified ovarian cyst, right side: Secondary | ICD-10-CM | POA: Insufficient documentation

## 2015-06-11 DIAGNOSIS — N736 Female pelvic peritoneal adhesions (postinfective): Secondary | ICD-10-CM | POA: Insufficient documentation

## 2015-06-11 DIAGNOSIS — Z8543 Personal history of malignant neoplasm of ovary: Secondary | ICD-10-CM

## 2015-06-11 DIAGNOSIS — M797 Fibromyalgia: Secondary | ICD-10-CM | POA: Diagnosis not present

## 2015-06-11 DIAGNOSIS — R971 Elevated cancer antigen 125 [CA 125]: Secondary | ICD-10-CM | POA: Insufficient documentation

## 2015-06-11 DIAGNOSIS — F419 Anxiety disorder, unspecified: Secondary | ICD-10-CM | POA: Insufficient documentation

## 2015-06-11 DIAGNOSIS — G43909 Migraine, unspecified, not intractable, without status migrainosus: Secondary | ICD-10-CM | POA: Diagnosis not present

## 2015-06-11 DIAGNOSIS — Z880 Allergy status to penicillin: Secondary | ICD-10-CM | POA: Diagnosis not present

## 2015-06-11 DIAGNOSIS — F329 Major depressive disorder, single episode, unspecified: Secondary | ICD-10-CM | POA: Insufficient documentation

## 2015-06-11 NOTE — Progress Notes (Signed)
Follow-up Note: Gyn-Onc  Sharon Holmes 35 y.o. female  CC:  Chief Complaint  Patient presents with  . Ovarian Cancer    F/U MD visit    Assessment/Plan: 35 year old with a stage IA unknown histology ovarian carcinoma diagnosed and treated in 2008. She has an elevated CA 125 (658) and 3cm right ovarian cyst.   Her CT otherwise does not show concerning signs for ovarian cancer. This cyst may be benign, however, given her history and the high rise in CA 125, I recommend surgery for evaluation and ovarian cystectomy. If malignancy is found, I recommend completion hysterectomy, RSO and debulking (if extrauterine cancer is present) and plan for retreatment with chemotherapy.   The patient has expressed that she does not desire future childbearing but is reluctant to go through an early menopause. I discussed that if oophorectomy is performed, this will result in permanent infertility, however, we can replace estrogen and she will not experience symptoms of menopause. Surgery is scheduled for later this week (laparoscopic right ovarian cystectomy, possible RSO, possible hysterectomy, possible laparotomy). I discussed operative risks including  bleeding, infection, damage to internal organs (such as bladder,ureters, bowels), blood clot, reoperation and rehospitalization.  HPI:   35 year old gravida 0 (DES grand daughter) in 2008 began feeling some fatigue bloating and was having some bleeding every week. She was told her that she might have polycystic ovarian syndrome. She then developed some sharp left-sided shoulder pain went to her local emergency room where an MRI was performed it revealed a 17 cm ovarian mass. She underwent exploratory laparotomy left salpingo-oophorectomy that revealed a this early stage ovarian cancer in 2008. She does not know the histology and we do not have a record of her final pathology from that surgery. After that she was referred to Dr. Susa Day a gynecologic  oncologist in [place]. She underwent complete staging at that time (that pathology is availalble) which was completely negative (including appendectomy) and she was felt to have a stage IA ovarian cancer was dispositioned to close followup. Her CA 125 was elevated at the time of her diagnosis and has been followed closely. Her CA-125 has been elevated from time to time and most recently in October 2011 they were elevated at 74. In 2010 she had an evaluation for a right ovarian mass and underwent a right ovarian cystectomy that revealed endometriosis. She was also diagnosed with left-sided pelvic adhesions. October 23, 2011 CA 125  was elevated at 55.6. Her last Pap smear was normal in 11/06/11.  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 She does have some breast tenderness week before her cycles.She occasionally has some ovarian discomfort she believes corresponds to ovulation in the right lower quadrant. She has mild cramping with her menstrual cycles. She has a brother pain or pressure. There are no new medical problems and her family. Her mother had Graves' disease and had a thyroidectomy and is currently on Synthroid. She also has fibromyalgia.   She has declined genetic screening. She had a history of anxiety and depression and she has seen a psychiatrist in the past year who prescribed Lexapro. This has improved her symptoms and she noticed weight gain with this (though she had experienced weight loss before this treatment began).  She had an elevated CA 125 when I saw her in  2015, (90's) however, a CT scan in October, 2015 showed no evidence of recurrence. It is normal for her CA 125 values to wax and wane. A CA 125 1 month ago was 75.  She saw Dr Sumner Boast on 04/10/15 for a symptomatic UTI. During pelvic exam a mass was  appreciated on bimanual exam. A TVUS was ordered and revealed a surgically absent left ovary. The right ovary contained a 2.8 x 1.9 cm echogenic mass with no internal blood flow, homogeneous throughout. It was felt to represent either a hemorrhagic cyst versus dermoid versus an endometrioma. No free fluid was seen. She is otherwise asymptomatic.  Interval History:  On 05/31/15 she underwent repeat CA 125 draw which was elevated to 658 (consistent rise from 26 in 2014, to 94 in 2015, now to 658). [place]  On 05/31/15 showed a right ovary with a cystic mass measuring 24x21x16mm with low level echoes, avascular, smooth borders.  CT scan of the abdomen and pelvis on 06/07/15 showed 3.3cm right ovarian cyst which overall has benign features. No extraovarian disease was identified.  Review of Systems:   Constitutional: Feeling well Skin: No rash, sores, jaundice, itching, or dryness.  Cardiovascular: No chest pain, shortness of breath, or edema  Pulmonary: No cough or wheeze.  Gastro Intestinal: No nausea, vomiting, constipation, or diarrhea reported. No bright red blood per rectum or change in bowel movement.  Genitourinary: No frequency, urgency, or dysuria.  Denies vaginal bleeding and discharge.  Musculoskeletal: No myalgia, arthralgia, joint swelling or pain.  Neurologic: No weakness, numbness, or change in gait.  Psychology: No issues .  Current Meds:  Outpatient Encounter Prescriptions as of 06/11/2015  Medication Sig  . acetaminophen (TYLENOL) 325 MG tablet Take 650 mg by mouth every 6 (six) hours as needed for mild pain or headache.   . Cholecalciferol (VITAMIN D3) 3000 UNITS TABS Take 1 tablet by mouth daily.   Marland Kitchen etonogestrel (NEXPLANON) 68 MG IMPL implant 1 each by Subdermal route once.  . [DISCONTINUED] buPROPion (WELLBUTRIN XL) 150 MG 24 hr tablet TAKE 1 TABLET BY MOUTH ONCE EVERY MORNING, 300 MG Daily  . [DISCONTINUED] DULoxetine (CYMBALTA) 60 MG capsule Take 60 mg by mouth daily.  .  [DISCONTINUED] Nutritional Supplements (NUTRITIONAL SHAKE PO) Take by mouth daily. Reported on 04/23/2015   No facility-administered encounter medications on file as of 06/11/2015.    Allergy:  Allergies  Allergen Reactions  . Amoxicillin     Has patient had a PCN reaction causing immediate rash, facial/tongue/throat swelling, SOB or lightheadedness with hypotension: No Has patient had a PCN reaction causing severe rash involving mucus membranes or skin necrosis: No Has patient had a PCN reaction that required hospitalization No Has patient had a PCN reaction occurring within the last 10 years:NO If all of the above answers are "NO", then may proceed with Cephalosporin use.  . Aspirin     Throat gets itchy  . Nsaids Itching    Itchy throat and gums, throat closes up; palpitations     Social Hx:   Social History   Social History  . Marital Status: Single    Spouse Name: N/A  . Number of Children: N/A  . Years of Education: N/A   Occupational History  . Not on file.   Social History Main Topics  . Smoking status: Never Smoker   . Smokeless tobacco: Never Used  . Alcohol Use: 0.6 - 1.2 oz/week    1-2 Standard drinks or equivalent per week  Comment: occas  . Drug Use: No  . Sexual Activity: Not Currently   Other Topics Concern  . Not on file   Social History Narrative   [place]   0 children   Exercise-treadmill, weights 1-2 times per week       Past Surgical Hx:  Past Surgical History  Procedure Laterality Date  . Salpingoophorectomy  07/2006    Left for Stage 1A ovarian cancer  . Appendectomy  08/2006  . Bunionectomy  02/2007  . Cystectomy  07/2008    from right ovary  . Wisdom tooth extraction  2015    Past Medical Hx:  Past Medical History  Diagnosis Date  . Dizziness   . Migraine     with aura around menses  . Anxiety   . Ovarian cancer ([place]) 2008    Stage 1A    Family Hx:  Family History  Problem  Relation Age of Onset  . Diabetes Maternal Aunt   . Heart attack Maternal Grandfather   . Heart disease Maternal Grandfather     Heart attack; CAD   . Fibromyalgia Mother   . Thyroid disease Mother   . Blindness Father   . Alcohol abuse Father     Vitals:  Blood pressure 123/81, pulse 81, temperature 97.3 F (36.3 C), temperature source Oral, resp. rate 16, height 4' 11.5" (1.511 m), weight 128 lb 12.8 oz (58.423 kg), last menstrual period 05/31/2015, SpO2 100 %.  Physical Exam: Well-nourished well-developed female in no acute distress.  Neck: Supple, no lymphadenopathy, no family.  Lungs: Clear to auscultation bilaterally.  Cardiovascular: Regular rate and rhythm.  Breasts: deferred  Abdomen: [place]. Well-healed vertical midline incision.There is no evidence of any incisional hernias. Abdomen is soft and nontender.  Groins: No lymphadenopathy.  Extremities: No edema.  Pelvic: Normal external female genitalia. The vagina is well epithelialized. The cervix is visualized. There's no visible lesions. Bimanual examination the uterus is freely mobile. It appears to be retroflexed. There is no adnexal masses. Rectal confirms no nodularity.    [name], MD 06/11/2015, 5:54 PM

## 2015-06-11 NOTE — Patient Instructions (Addendum)
Sharon Holmes  06/11/2015   Your procedure is scheduled on: 06/14/2015    Report to [facility] Main  Entrance take [place]  elevators to 3rd floor to  [place] at   0930 AM.  Call this number if you have problems the morning of surgery [phone]   Remember: ONLY 1 PERSON MAY GO WITH YOU TO SHORT STAY TO GET  READY MORNING OF [place].  Clear liquid diet beginning on 06/13/2015, no clear liquids after midnight.       Take these medicines the morning of surgery with A SIP OF WATER: Wellbutrin, Cymbalta                                 You may not have any metal on your body including hair pins and              piercings  Do not wear jewelry, make-up, lotions, powders or perfumes, deodorant             Do not wear nail polish.  Do not shave  48 hours prior to surgery.                 Do not bring valuables to the hospital. [place].  Contacts, dentures or bridgework may not be worn into surgery.       Patients discharged the day of surgery will not be allowed to drive home.  Name and phone number of your driver: not sure will arrange driver  Special Instructions: coughing and deep breathing exercises, leg exercises               Please read over the following fact sheets you were given: _____________________________________________________________________             [facility] - Preparing for Surgery Before surgery, you can play an important role.  Because skin is not sterile, your skin needs to be as free of germs as possible.  You can reduce the number of germs on your skin by washing with CHG (chlorahexidine gluconate) soap before surgery.  CHG is an antiseptic cleaner which kills germs and bonds with the skin to continue killing germs even after washing. Please DO NOT use if you have an allergy to CHG or antibacterial soaps.  If your skin becomes reddened/irritated stop using the CHG and inform  your nurse when you arrive at Short Stay. Do not shave (including legs and underarms) for at least 48 hours prior to the first CHG shower.  You may shave your face/neck. Please follow these instructions carefully:  1.  Shower with CHG Soap the night before surgery and the  morning of Surgery.  2.  If you choose to wash your hair, wash your hair first as usual with your  normal  shampoo.  3.  After you shampoo, rinse your hair and body thoroughly to remove the  shampoo.                           4.  Use CHG as you would any other liquid soap.  You can apply chg directly  to the skin and wash  Gently with a scrungie or clean washcloth.  5.  Apply the CHG Soap to your body ONLY FROM THE NECK DOWN.   Do not use on face/ open                           Wound or open sores. Avoid contact with eyes, ears mouth and genitals (private parts).                       Wash face,  Genitals (private parts) with your normal soap.             6.  Wash thoroughly, paying special attention to the area where your surgery  will be performed.  7.  Thoroughly rinse your body with warm water from the neck down.  8.  DO NOT shower/wash with your normal soap after using and rinsing off  the CHG Soap.                9.  Pat yourself dry with a clean towel.            10.  Wear clean pajamas.            11.  Place clean sheets on your bed the night of your first shower and do not  sleep with pets. Day of Surgery : Do not apply any lotions/deodorants the morning of surgery.  Please wear clean clothes to the hospital/surgery center.  FAILURE TO FOLLOW THESE INSTRUCTIONS MAY RESULT IN THE CANCELLATION OF YOUR SURGERY PATIENT SIGNATURE_________________________________  NURSE SIGNATURE__________________________________  ________________________________________________________________________    CLEAR LIQUID DIET   Foods Allowed                                                                      Foods Excluded  Coffee and tea, regular and decaf                             liquids that you cannot  Plain Jell-O in any flavor                                             see through such as: Fruit ices (not with fruit pulp)                                     milk, soups, orange juice  Iced Popsicles                                    All solid food Carbonated beverages, regular and diet                                    Cranberry, grape and apple juices Sports drinks like Gatorade Lightly seasoned clear broth or consume(fat free) Sugar, honey syrup  Sample Menu Breakfast                                Lunch                                     Supper Cranberry juice                    Beef broth                            Chicken broth Jell-O                                     Grape juice                           Apple juice Coffee or tea                        Jell-O                                      Popsicle                                                Coffee or tea                        Coffee or tea  _____________________________________________________________________   WHAT IS A BLOOD TRANSFUSION? Blood Transfusion Information  A transfusion is the replacement of blood or some of its parts. Blood is made up of multiple cells which provide different functions.  Red blood cells carry oxygen and are used for blood loss replacement.  White blood cells fight against infection.  Platelets control bleeding.  Plasma helps clot blood.  Other blood products are available for specialized needs, such as hemophilia or other clotting disorders. BEFORE THE TRANSFUSION  Who gives blood for transfusions?   Healthy volunteers who are fully evaluated to make sure their blood is safe. This is blood bank blood. Transfusion therapy is the safest it has ever been in the practice of medicine. Before blood is taken from a donor, a complete history is taken to make sure that person  has no history of diseases nor engages in risky social behavior (examples are intravenous drug use or sexual activity with multiple partners). The donor's travel history is screened to minimize risk of transmitting infections, such as malaria. The donated blood is tested for signs of infectious diseases, such as HIV and hepatitis. The blood is then tested to be sure it is compatible with you in order to minimize the chance of a transfusion reaction. If you or a relative donates blood, this is often done in anticipation of surgery and is not appropriate for emergency situations. It takes many days to process the donated blood. RISKS AND COMPLICATIONS Although transfusion therapy is very safe and saves many lives, the main dangers of transfusion include:  1. Getting an infectious disease. 2. Developing a transfusion reaction. This is an allergic reaction to something in the blood you were given. Every precaution is taken to prevent this. The decision to have a blood transfusion has been considered carefully by your caregiver before blood is given. Blood is not given unless the benefits outweigh the risks. AFTER THE TRANSFUSION  Right after receiving a blood transfusion, you will usually feel much better and more energetic. This is especially true if your red blood cells have gotten low (anemic). The transfusion raises the level of the red blood cells which carry oxygen, and this usually causes an energy increase.  The nurse administering the transfusion will monitor you carefully for complications. HOME CARE INSTRUCTIONS  No special instructions are needed after a transfusion. You may find your energy is better. Speak with your caregiver about any limitations on activity for underlying diseases you may have. SEEK MEDICAL CARE IF:   Your condition is not improving after your transfusion.  You develop redness or irritation at the intravenous (IV) site. SEEK IMMEDIATE MEDICAL CARE IF:  Any of the  following symptoms occur over the next 12 hours:  Shaking chills.  You have a temperature by mouth above 102 F (38.9 C), not controlled by medicine.  Chest, back, or muscle pain.  People around you feel you are not acting correctly or are confused.  Shortness of breath or difficulty breathing.  Dizziness and fainting.  You get a rash or develop hives.  You have a decrease in urine output.  Your urine turns a dark color or changes to pink, red, or brown. Any of the following symptoms occur over the next 10 days:  You have a temperature by mouth above 102 F (38.9 C), not controlled by medicine.  Shortness of breath.  Weakness after normal activity.  The white part of the eye turns yellow (jaundice).  You have a decrease in the amount of urine or are urinating less often.  Your urine turns a dark color or changes to pink, red, or brown. Document Released: 02/22/2000 Document Revised: 05/19/2011 Document Reviewed: 10/11/2007 ExitCare Patient Information 2014 [place].  _______________________________________________________________________  Incentive Spirometer  An incentive spirometer is a tool that can help keep your lungs clear and active. This tool measures how well you are filling your lungs with each breath. Taking long deep breaths may help reverse or decrease the chance of developing breathing (pulmonary) problems (especially infection) following:  A long period of time when you are unable to move or be active. BEFORE THE PROCEDURE   If the spirometer includes an indicator to show your best effort, your nurse or respiratory therapist will set it to a desired goal.  If possible, sit up straight or lean slightly forward. Try not to slouch.  Hold the incentive spirometer in an upright position. INSTRUCTIONS FOR USE  3. Sit on the edge of your bed if possible, or sit up as far as you can in bed or on a chair. 4. Hold the incentive spirometer in an upright  position. 5. Breathe out normally. 6. Place the mouthpiece in your mouth and seal your lips tightly around it. 7. Breathe in slowly and as deeply as possible, raising the piston or the ball toward the top of the column. 8. Hold your breath for 3-5 seconds or for as long as possible. Allow the piston or ball to fall to the bottom of the column. 9. Remove the mouthpiece from your mouth and breathe out  normally. 10. Rest for a few seconds and repeat Steps 1 through 7 at least 10 times every 1-2 hours when you are awake. Take your time and take a few normal breaths between deep breaths. 11. The spirometer may include an indicator to show your best effort. Use the indicator as a goal to work toward during each repetition. 12. After each set of 10 deep breaths, practice coughing to be sure your lungs are clear. If you have an incision (the cut made at the time of surgery), support your incision when coughing by placing a pillow or rolled up towels firmly against it. Once you are able to get out of bed, walk around indoors and cough well. You may stop using the incentive spirometer when instructed by your caregiver.  RISKS AND COMPLICATIONS  Take your time so you do not get dizzy or light-headed.  If you are in pain, you may need to take or ask for pain medication before doing incentive spirometry. It is harder to take a deep breath if you are having pain. AFTER USE  Rest and breathe slowly and easily.  It can be helpful to keep track of a log of your progress. Your caregiver can provide you with a simple table to help with this. If you are using the spirometer at home, follow these instructions: [place] IF:   You are having difficultly using the spirometer.  You have trouble using the spirometer as often as instructed.  Your pain medication is not giving enough relief while using the spirometer.  You develop fever of 100.5 F (38.1 C) or higher. SEEK IMMEDIATE MEDICAL CARE IF:    You cough up bloody sputum that had not been present before.  You develop fever of 102 F (38.9 C) or greater.  You develop worsening pain at or near the incision site. MAKE SURE YOU:   Understand these instructions.  Will watch your condition.  Will get help right away if you are not doing well or get worse. Document Released: 07/07/2006 Document Revised: 05/19/2011 Document Reviewed: 09/07/2006 [facility] Patient Information 2014 [place], [place].   ________________________________________________________________________

## 2015-06-11 NOTE — Patient Instructions (Addendum)
Preparing for your Surgery  Plan for surgery on April 6 with Dr. Everitt Amber.  You will be scheduled for a laparoscopic right ovarian cystectomy, possible hysterectomy, possible right salpingo-oophorectomy.  Pre-operative Testing -You will receive a phone call from presurgical testing at [facility] to arrange for a pre-operative testing appointment before your surgery.  This appointment normally occurs one to two weeks before your scheduled surgery.   -Bring your insurance card, copy of an advanced directive if applicable, medication list  -At that visit, you will be asked to sign a consent for a possible blood transfusion in case a transfusion becomes necessary during surgery.  The need for a blood transfusion is rare but having consent is a necessary part of your care.     -You should not be taking blood thinners or aspirin at least ten days prior to surgery unless instructed by your surgeon.  Day Before Surgery at [facility] will be asked to take in only clear liquids the day before surgery.  Examples of clear liquids include broths, jello, and clear juices.  Avoid carbonated beverages.  You will be advised to have nothing to eat or drink after midnight the evening before.    Your role in recovery Your role is to become active as soon as directed by your doctor, while still giving yourself time to heal.  Rest when you feel tired. You will be asked to do the following in order to speed your recovery:  - Cough and breathe deeply. This helps toclear and expand your lungs and can prevent pneumonia. You may be given a spirometer to practice deep breathing. A staff member will show you how to use the spirometer. - Do mild physical activity. Walking or moving your legs help your circulation and body functions return to normal. A staff member will help you when you try to walk and will provide you with simple exercises. Do not try to get up or walk alone the first time. -  Actively manage your pain. Managing your pain lets you move in comfort. We will ask you to rate your pain on a scale of zero to 10. It is your responsibility to tell your doctor or nurse where and how much you hurt so your pain can be treated.  Special Considerations -If you are diabetic, you may be placed on insulin after surgery to have closer control over your blood sugars to promote healing and recovery.  This does not mean that you will be discharged on insulin.  If applicable, your oral antidiabetics will be resumed when you are tolerating a solid diet.  -Your final pathology results from surgery should be available by the Friday after surgery and the results will be relayed to you when available.  Blood Transfusion Information WHAT IS A BLOOD TRANSFUSION? A transfusion is the replacement of blood or some of its parts. Blood is made up of multiple cells which provide different functions.  Red blood cells carry oxygen and are used for blood loss replacement.  White blood cells fight against infection.  Platelets control bleeding.  Plasma helps clot blood.  Other blood products are available for specialized needs, such as hemophilia or other clotting disorders. BEFORE THE TRANSFUSION  Who gives blood for transfusions?   You may be able to donate blood to be used at a later date on yourself (autologous donation).  Relatives can be asked to donate blood. This is generally not any safer than if you have received blood from a  stranger. The same precautions are taken to ensure safety when a relative's blood is donated.  Healthy volunteers who are fully evaluated to make sure their blood is safe. This is blood bank blood. Transfusion therapy is the safest it has ever been in the practice of medicine. Before blood is taken from a donor, a complete history is taken to make sure that person has no history of diseases nor engages in risky social behavior (examples are intravenous drug use or  sexual activity with multiple partners). The donor's travel history is screened to minimize risk of transmitting infections, such as malaria. The donated blood is tested for signs of infectious diseases, such as HIV and hepatitis. The blood is then tested to be sure it is compatible with you in order to minimize the chance of a transfusion reaction. If you or a relative donates blood, this is often done in anticipation of surgery and is not appropriate for emergency situations. It takes many days to process the donated blood. RISKS AND COMPLICATIONS Although transfusion therapy is very safe and saves many lives, the main dangers of transfusion include:   Getting an infectious disease.  Developing a transfusion reaction. This is an allergic reaction to something in the blood you were given. Every precaution is taken to prevent this. The decision to have a blood transfusion has been considered carefully by your caregiver before blood is given. Blood is not given unless the benefits outweigh the risks.

## 2015-06-12 ENCOUNTER — Telehealth: Payer: Self-pay | Admitting: Gynecologic Oncology

## 2015-06-12 ENCOUNTER — Encounter (HOSPITAL_COMMUNITY): Payer: Self-pay

## 2015-06-12 ENCOUNTER — Encounter (HOSPITAL_COMMUNITY)
Admission: RE | Admit: 2015-06-12 | Discharge: 2015-06-12 | Disposition: A | Discharge status: Home / Self Care | Payer: BC Managed Care – PPO | Source: Ambulatory Visit | Attending: Gynecologic Oncology | Admitting: Gynecologic Oncology

## 2015-06-12 DIAGNOSIS — Z01812 Encounter for preprocedural laboratory examination: Secondary | ICD-10-CM | POA: Diagnosis not present

## 2015-06-12 DIAGNOSIS — Z0183 Encounter for blood typing: Secondary | ICD-10-CM | POA: Diagnosis not present

## 2015-06-12 DIAGNOSIS — N83209 Unspecified ovarian cyst, unspecified side: Secondary | ICD-10-CM | POA: Insufficient documentation

## 2015-06-12 HISTORY — DX: Gastro-esophageal reflux disease without esophagitis: K21.9

## 2015-06-12 HISTORY — DX: Depression, unspecified: F32.A

## 2015-06-12 HISTORY — DX: Major depressive disorder, single episode, unspecified: F32.9

## 2015-06-12 LAB — PREGNANCY, URINE: Preg Test, Ur: NEGATIVE

## 2015-06-12 LAB — CBC
HCT: 39.3 % (ref 36.0–46.0)
Hemoglobin: 13.5 g/dL (ref 12.0–15.0)
MCH: 31 pg (ref 26.0–34.0)
MCHC: 34.4 g/dL (ref 30.0–36.0)
MCV: 90.1 fL (ref 78.0–100.0)
PLATELETS: 308 10*3/uL (ref 150–400)
RBC: 4.36 MIL/uL (ref 3.87–5.11)
RDW: 14.1 % (ref 11.5–15.5)
WBC: 7.5 10*3/uL (ref 4.0–10.5)

## 2015-06-12 LAB — CA 125: Cancer Antigen (CA) 125: 229.2 U/mL — ABNORMAL HIGH (ref 0.0–38.1)

## 2015-06-12 LAB — ABO/RH: ABO/RH(D): B POS

## 2015-06-12 NOTE — Telephone Encounter (Signed)
Message left for patient stating her CA 125 is less than previously, but still elevated and up from 1 mth ago.  Dr. Denman George is still recommending surgery but has a lower suspicion that this is a cancer.  Advised to call back for any concerns.

## 2015-06-12 NOTE — Progress Notes (Signed)
SIGNED HYSTERECTOMY STATEMENT PLACED ON PATIENT CHART

## 2015-06-14 ENCOUNTER — Encounter (HOSPITAL_COMMUNITY): Payer: Self-pay | Admitting: *Deleted

## 2015-06-14 ENCOUNTER — Ambulatory Visit (HOSPITAL_COMMUNITY): Payer: BC Managed Care – PPO | Admitting: Certified Registered"

## 2015-06-14 ENCOUNTER — Ambulatory Visit (HOSPITAL_COMMUNITY)
Admission: RE | Admit: 2015-06-14 | Discharge: 2015-06-14 | Disposition: A | Discharge status: Home / Self Care | Payer: BC Managed Care – PPO | Source: Ambulatory Visit | Attending: Gynecologic Oncology | Admitting: Gynecologic Oncology

## 2015-06-14 ENCOUNTER — Encounter (HOSPITAL_COMMUNITY)
Admission: RE | Disposition: A | Discharge status: Home / Self Care | Payer: Self-pay | Source: Ambulatory Visit | Attending: Gynecologic Oncology

## 2015-06-14 DIAGNOSIS — N801 Endometriosis of ovary: Secondary | ICD-10-CM

## 2015-06-14 DIAGNOSIS — N83201 Unspecified ovarian cyst, right side: Secondary | ICD-10-CM | POA: Insufficient documentation

## 2015-06-14 DIAGNOSIS — D270 Benign neoplasm of right ovary: Secondary | ICD-10-CM | POA: Insufficient documentation

## 2015-06-14 DIAGNOSIS — M797 Fibromyalgia: Secondary | ICD-10-CM | POA: Diagnosis not present

## 2015-06-14 DIAGNOSIS — C569 Malignant neoplasm of unspecified ovary: Secondary | ICD-10-CM | POA: Diagnosis present

## 2015-06-14 DIAGNOSIS — R971 Elevated cancer antigen 125 [CA 125]: Secondary | ICD-10-CM | POA: Diagnosis not present

## 2015-06-14 DIAGNOSIS — Z8543 Personal history of malignant neoplasm of ovary: Secondary | ICD-10-CM | POA: Diagnosis not present

## 2015-06-14 DIAGNOSIS — N80109 Endometriosis of ovary, unspecified side, unspecified depth: Secondary | ICD-10-CM | POA: Insufficient documentation

## 2015-06-14 HISTORY — PX: ROBOTIC ASSISTED LAPAROSCOPIC OVARIAN CYSTECTOMY: SHX6081

## 2015-06-14 LAB — TYPE AND SCREEN
ABO/RH(D): B POS
Antibody Screen: NEGATIVE

## 2015-06-14 SURGERY — EXCISION, CYST, OVARY, ROBOT-ASSISTED, LAPAROSCOPIC
Anesthesia: General | Site: Abdomen | Laterality: Right

## 2015-06-14 MED ORDER — FENTANYL CITRATE (PF) 100 MCG/2ML IJ SOLN
INTRAMUSCULAR | Status: DC | PRN
  Administered 2015-06-14 (×2): 50 ug via INTRAVENOUS
  Administered 2015-06-14: 100 ug via INTRAVENOUS
  Administered 2015-06-14: 50 ug via INTRAVENOUS

## 2015-06-14 MED ORDER — ONDANSETRON HCL 4 MG/2ML IJ SOLN
INTRAMUSCULAR | Status: DC | PRN
  Administered 2015-06-14: 4 mg via INTRAVENOUS

## 2015-06-14 MED ORDER — SODIUM CHLORIDE 0.9% FLUSH: 3.0000 mL | INTRAVENOUS | Status: DC | PRN

## 2015-06-14 MED ORDER — SUGAMMADEX SODIUM 200 MG/2ML IV SOLN
INTRAVENOUS | Status: AC
  Filled 2015-06-14: qty 2

## 2015-06-14 MED ORDER — ACETAMINOPHEN 325 MG PO TABS: 650.0000 mg | ORAL_TABLET | ORAL | Status: DC | PRN

## 2015-06-14 MED ORDER — STERILE WATER FOR IRRIGATION IR SOLN
Status: DC | PRN
  Administered 2015-06-14: 1000 mL

## 2015-06-14 MED ORDER — ONDANSETRON HCL 4 MG/2ML IJ SOLN
INTRAMUSCULAR | Status: AC
Start: 2015-06-14 — End: 2015-06-14
  Filled 2015-06-14: qty 2

## 2015-06-14 MED ORDER — ROCURONIUM BROMIDE 100 MG/10ML IV SOLN
INTRAVENOUS | Status: DC | PRN
  Administered 2015-06-14: 50 mg via INTRAVENOUS

## 2015-06-14 MED ORDER — DEXAMETHASONE SODIUM PHOSPHATE 10 MG/ML IJ SOLN
INTRAMUSCULAR | Status: DC | PRN
  Administered 2015-06-14: 10 mg via INTRAVENOUS

## 2015-06-14 MED ORDER — DEXAMETHASONE SODIUM PHOSPHATE 10 MG/ML IJ SOLN
INTRAMUSCULAR | Status: AC
  Filled 2015-06-14: qty 1

## 2015-06-14 MED ORDER — LIDOCAINE HCL (PF) 2 % IJ SOLN
INTRAMUSCULAR | Status: DC | PRN
  Administered 2015-06-14: 20 mg via INTRADERMAL

## 2015-06-14 MED ORDER — MIDAZOLAM HCL 5 MG/5ML IJ SOLN
INTRAMUSCULAR | Status: DC | PRN
  Administered 2015-06-14: 2 mg via INTRAVENOUS

## 2015-06-14 MED ORDER — SODIUM CHLORIDE 0.9% FLUSH: 3.0000 mL | Freq: Two times a day (BID) | INTRAVENOUS | Status: DC

## 2015-06-14 MED ORDER — HYDROMORPHONE HCL 2 MG/ML IJ SOLN
INTRAMUSCULAR | Status: AC
  Filled 2015-06-14: qty 1

## 2015-06-14 MED ORDER — OXYCODONE HCL 5 MG PO TABS
5.0000 mg | ORAL_TABLET | ORAL | Status: DC | PRN
  Administered 2015-06-14: 5 mg via ORAL
  Filled 2015-06-14: qty 1

## 2015-06-14 MED ORDER — SUGAMMADEX SODIUM 200 MG/2ML IV SOLN
INTRAVENOUS | Status: DC | PRN
  Administered 2015-06-14: 150 mg via INTRAVENOUS

## 2015-06-14 MED ORDER — ACETAMINOPHEN 10 MG/ML IV SOLN
INTRAVENOUS | Status: DC | PRN
  Administered 2015-06-14: 1000 mg via INTRAVENOUS

## 2015-06-14 MED ORDER — MORPHINE SULFATE (PF) 10 MG/ML IV SOLN: 2.0000 mg | INTRAVENOUS | Status: DC | PRN

## 2015-06-14 MED ORDER — EPHEDRINE SULFATE 50 MG/ML IJ SOLN
INTRAMUSCULAR | Status: DC | PRN
  Administered 2015-06-14: 10 mg via INTRAVENOUS

## 2015-06-14 MED ORDER — LACTATED RINGERS IV SOLN
INTRAVENOUS | Status: DC
  Administered 2015-06-14: 12:00:00 via INTRAVENOUS

## 2015-06-14 MED ORDER — ACETAMINOPHEN 500 MG PO TABS
1000.0000 mg | ORAL_TABLET | Freq: Four times a day (QID) | ORAL | Status: DC
  Filled 2015-06-14 (×4): qty 2

## 2015-06-14 MED ORDER — EPHEDRINE SULFATE 50 MG/ML IJ SOLN
INTRAMUSCULAR | Status: AC
  Filled 2015-06-14: qty 1

## 2015-06-14 MED ORDER — CEFAZOLIN SODIUM-DEXTROSE 2-4 GM/100ML-% IV SOLN
INTRAVENOUS | Status: AC
  Filled 2015-06-14: qty 100

## 2015-06-14 MED ORDER — OXYCODONE-ACETAMINOPHEN 10-325 MG PO TABS: 1.0000 | ORAL_TABLET | ORAL | Status: DC | PRN

## 2015-06-14 MED ORDER — PROPOFOL 10 MG/ML IV BOLUS
INTRAVENOUS | Status: DC | PRN
  Administered 2015-06-14: 130 mg via INTRAVENOUS

## 2015-06-14 MED ORDER — ROCURONIUM BROMIDE 100 MG/10ML IV SOLN
INTRAVENOUS | Status: AC
  Filled 2015-06-14: qty 1

## 2015-06-14 MED ORDER — ACETAMINOPHEN 650 MG RE SUPP
650.0000 mg | RECTAL | Status: DC | PRN
  Filled 2015-06-14: qty 1

## 2015-06-14 MED ORDER — MIDAZOLAM HCL 2 MG/2ML IJ SOLN
INTRAMUSCULAR | Status: AC
  Filled 2015-06-14: qty 2

## 2015-06-14 MED ORDER — LACTATED RINGERS IR SOLN
Status: DC | PRN
  Administered 2015-06-14: 15:00:00
  Administered 2015-06-14: 1000 mL

## 2015-06-14 MED ORDER — ACETAMINOPHEN 10 MG/ML IV SOLN
INTRAVENOUS | Status: AC
  Filled 2015-06-14: qty 100

## 2015-06-14 MED ORDER — SODIUM CHLORIDE 0.9 % IV SOLN: 250.0000 mL | INTRAVENOUS | Status: DC | PRN

## 2015-06-14 MED ORDER — FENTANYL CITRATE (PF) 250 MCG/5ML IJ SOLN
INTRAMUSCULAR | Status: AC
  Filled 2015-06-14: qty 5

## 2015-06-14 MED ORDER — PROPOFOL 10 MG/ML IV BOLUS
INTRAVENOUS | Status: AC
  Filled 2015-06-14: qty 20

## 2015-06-14 MED ORDER — SODIUM CHLORIDE 0.9 % IJ SOLN
INTRAMUSCULAR | Status: AC
  Filled 2015-06-14: qty 10

## 2015-06-14 MED ORDER — CEFAZOLIN SODIUM-DEXTROSE 2-4 GM/100ML-% IV SOLN
2.0000 g | INTRAVENOUS | Status: AC
  Administered 2015-06-14: 2 g via INTRAVENOUS
  Filled 2015-06-14: qty 100

## 2015-06-14 MED FILL — OXYCODONE-APAP 10-325 TAB: 10-325 | 5 days supply | Qty: 30 | Fill #0

## 2015-06-14 SURGICAL SUPPLY — 56 items
APPLICATOR SURGIFLO ENDO (HEMOSTASIS) IMPLANT
CHLORAPREP W/TINT 26ML (MISCELLANEOUS) ×3 IMPLANT
COVER SURGICAL LIGHT HANDLE (MISCELLANEOUS) ×3 IMPLANT
COVER TIP SHEARS 8 DVNC (MISCELLANEOUS) ×2 IMPLANT
COVER TIP SHEARS 8MM DA VINCI (MISCELLANEOUS) ×1
DRAPE ARM DVNC X/XI (DISPOSABLE) ×8 IMPLANT
DRAPE COLUMN DVNC XI (DISPOSABLE) ×2 IMPLANT
DRAPE DA VINCI XI ARM (DISPOSABLE) ×4
DRAPE DA VINCI XI COLUMN (DISPOSABLE) ×1
DRAPE SHEET LG 3/4 BI-LAMINATE (DRAPES) ×6 IMPLANT
DRAPE SURG IRRIG POUCH 19X23 (DRAPES) ×3 IMPLANT
DRAPE TABLE BACK 44X90 PK DISP (DRAPES) ×3 IMPLANT
DRSG TEGADERM 2-3/8X2-3/4 SM (GAUZE/BANDAGES/DRESSINGS) ×6 IMPLANT
ELECT REM PT RETURN 9FT ADLT (ELECTROSURGICAL) ×3
ELECTRODE REM PT RTRN 9FT ADLT (ELECTROSURGICAL) ×2 IMPLANT
GLOVE BIO SURGEON STRL SZ 6 (GLOVE) ×12 IMPLANT
GLOVE BIO SURGEON STRL SZ 6.5 (GLOVE) ×6 IMPLANT
GOWN STRL REUS W/ TWL LRG LVL3 (GOWN DISPOSABLE) ×6 IMPLANT
GOWN STRL REUS W/TWL LRG LVL3 (GOWN DISPOSABLE) ×3
HOLDER FOLEY CATH W/STRAP (MISCELLANEOUS) ×3 IMPLANT
KIT BASIN OR (CUSTOM PROCEDURE TRAY) ×3 IMPLANT
KIT PROCEDURE DA VINCI SI (MISCELLANEOUS)
KIT PROCEDURE DVNC SI (MISCELLANEOUS) IMPLANT
LIQUID BAND (GAUZE/BANDAGES/DRESSINGS) ×3 IMPLANT
MANIPULATOR UTERINE 4.5 ZUMI (MISCELLANEOUS) ×3 IMPLANT
MARKER SKIN DUAL TIP RULER LAB (MISCELLANEOUS) ×3 IMPLANT
NDL SAFETY ECLIPSE 18X1.5 (NEEDLE) IMPLANT
NEEDLE HYPO 18GX1.5 SHARP (NEEDLE)
NEEDLE SPNL 18GX3.5 QUINCKE PK (NEEDLE) IMPLANT
OBTURATOR XI 8MM BLADELESS (TROCAR) ×3 IMPLANT
OCCLUDER COLPOPNEUMO (BALLOONS) ×3 IMPLANT
PAD POSITIONING PINK XL (MISCELLANEOUS) ×3 IMPLANT
PORT ACCESS TROCAR AIRSEAL 12 (TROCAR) ×2 IMPLANT
PORT ACCESS TROCAR AIRSEAL 5M (TROCAR) ×1
POUCH ENDO CATCH II 15MM (MISCELLANEOUS) IMPLANT
POUCH SPECIMEN RETRIEVAL 10MM (ENDOMECHANICALS) ×3 IMPLANT
SEAL CANN UNIV 5-8 DVNC XI (MISCELLANEOUS) ×8 IMPLANT
SEAL XI 5MM-8MM UNIVERSAL (MISCELLANEOUS) ×4
SET BI-LUMEN FLTR TB AIRSEAL (TUBING) ×3 IMPLANT
SET TRI-LUMEN FLTR TB AIRSEAL (TUBING) ×3 IMPLANT
SET TUBE IRRIG SUCTION NO TIP (IRRIGATION / IRRIGATOR) ×3 IMPLANT
SHEET LAVH (DRAPES) ×3 IMPLANT
SOLUTION ELECTROLUBE (MISCELLANEOUS) ×3 IMPLANT
SURGIFLO W/THROMBIN 8M KIT (HEMOSTASIS) ×3 IMPLANT
SUT MNCRL AB 4-0 PS2 18 (SUTURE) ×6 IMPLANT
SUT VIC AB 0 CT1 27 (SUTURE) ×1
SUT VIC AB 0 CT1 27XBRD ANTBC (SUTURE) ×2 IMPLANT
SYR 50ML LL SCALE MARK (SYRINGE) ×3 IMPLANT
SYRINGE 10CC LL (SYRINGE) IMPLANT
TOWEL OR 17X26 10 PK STRL BLUE (TOWEL DISPOSABLE) ×6 IMPLANT
TOWEL OR NON WOVEN STRL DISP B (DISPOSABLE) ×3 IMPLANT
TRAP SPECIMEN MUCOUS 40CC (MISCELLANEOUS) ×3 IMPLANT
TRAY FOLEY W/METER SILVER 14FR (SET/KITS/TRAYS/PACK) ×3 IMPLANT
TRAY LAPAROSCOPIC (CUSTOM PROCEDURE TRAY) ×3 IMPLANT
TROCAR BLADELESS OPT 5 100 (ENDOMECHANICALS) ×3 IMPLANT
WATER STERILE IRR 1500ML POUR (IV SOLUTION) ×6 IMPLANT

## 2015-06-14 NOTE — Op Note (Signed)
OPERATIVE NOTE  Date: 06/14/15  Preoperative Diagnosis: history of ovarian cancer, right ovarian cyst, elevated CA 125   Postoperative Diagnosis:  Right ovarian endometrioma  Procedure(s) Performed: Robotic-assisted laparoscopic right ovarian cystectomy  Surgeon: Everitt Amber, M.D.  Assistant Surgeon: Lahoma Crocker M.D. (an MD assistant was necessary for tissue manipulation, management of robotic instrumentation, retraction and positioning due to the complexity of the case and hospital policies).   Anesthesia: Gen. endotracheal.  Specimens: Bilateral ovaries, fallopian tubes, pelvic washings  Estimated Blood Loss: <20 mL. Blood Replacement: None  Complications: none  Indication for Procedure:  The patient had detection of an elevated CA 125 and complex right ovarian cyst during surveillance examinations for her history of ovarian cancer.  Operative Findings: Enlarged (6cm) right ovary with dominant cyst (chocolate fluid filled) which was an endometrioma on frozen section. There were multiple (approximately 6) 1-2cm simple cysts in the ovary also removed. All unavoidably ruptured during extraction. There was no evidence of recurrent cancer in the peritoneal cavity (normal uterus, surgically absent left tube and ovary, normal omentum, normal peritoneum, normal diaphragms). Surgically absent appendix. There were endoemtriosis powderburn lesions on the posterior uterus.  Frozen pathology was consistent with endometrioma  Procedure: The patient's taken to the operating room and placed under general endotracheal anesthesia testing difficulty. She is placed in a dorsolithotomy position and cervical acromial pad was placed. The arms were tucked with care taken to pad the olecranon process. And prepped and draped in usual sterile fashion. A uterine manipulator (zumi) was placed vaginally. A 44mm incision was made in the left upper quadrant palmer's point and a 5 mm Optiview trocar used to enter  the abdomen under direct visualization. With entry into the abdomen and then maintenance of 15 mm of mercury the patient was placed in Trendelenburg position. An incision was made in the umbilicus and a 47mm trochar was placed through this site. Two incisions were made lateral to the umbilical incision in the left and right abdomen measuring 84mm. These incisions were made approximately 10 cm lateral to the umbilical incision. 8 mm robotic trochars were inserted. The robot was docked.  The abdomen was inspected as was the pelvis.  Pelvic washings were obtained. The ovarian cortex overlying the cyst was incised with the scissors. In doing so there was unavoidable spillage of cyst fluid. THe cyst wall was grasped and gently freed from the ovarian attachments, placed in an endocatch bag and sent for pathology. All other visible cysts were extracted from the ovary and sent in aggregate for pathology. The ovarian bed was made hemostatic with judicious use of bipolar sealing. Irrigation was performed. Hemostasis was confirmed.  The robot was undocked. The ports were all remove. The fascial closure at the umbilical incision and left upper quadrant port was made with 0 Vicryl.  All incisions were closed with a running subcuticular Monocryl suture. Dermabond was applied. Sponge, lap and needle counts were correct x 3.    The patient had sequential compression devices for VTE prophylaxis.         Disposition: PACU          Condition: stable  [name], MD

## 2015-06-14 NOTE — H&P (View-Only) (Signed)
Follow-up Note: Gyn-Onc  Sharon Holmes 35 y.o. female  CC:  Chief Complaint  Patient presents with  . Ovarian Cancer    F/U MD visit    Assessment/Plan: 35 year old with a stage IA unknown histology ovarian carcinoma diagnosed and treated in 2008. She has an elevated CA 125 (658) and 3cm right ovarian cyst.   Her CT otherwise does not show concerning signs for ovarian cancer. This cyst may be benign, however, given her history and the high rise in CA 125, I recommend surgery for evaluation and ovarian cystectomy. If malignancy is found, I recommend completion hysterectomy, RSO and debulking (if extrauterine cancer is present) and plan for retreatment with chemotherapy.   The patient has expressed that she does not desire future childbearing but is reluctant to go through an early menopause. I discussed that if oophorectomy is performed, this will result in permanent infertility, however, we can replace estrogen and she will not experience symptoms of menopause. Surgery is scheduled for later this week (laparoscopic right ovarian cystectomy, possible RSO, possible hysterectomy, possible laparotomy). I discussed operative risks including  bleeding, infection, damage to internal organs (such as bladder,ureters, bowels), blood clot, reoperation and rehospitalization.  HPI:   35 year old gravida 0 (DES grand daughter) in 2008 began feeling some fatigue bloating and was having some bleeding every week. She was told her that she might have polycystic ovarian syndrome. She then developed some sharp left-sided shoulder pain went to her local emergency room where an MRI was performed it revealed a 17 cm ovarian mass. She underwent exploratory laparotomy left salpingo-oophorectomy that revealed a this early stage ovarian cancer in 2008. She does not know the histology and we do not have a record of her final pathology from that surgery. After that she was referred to Dr. Susa Day a gynecologic  oncologist in [place]. She underwent complete staging at that time (that pathology is availalble) which was completely negative (including appendectomy) and she was felt to have a stage IA ovarian cancer was dispositioned to close followup. Her CA 125 was elevated at the time of her diagnosis and has been followed closely. Her CA-125 has been elevated from time to time and most recently in October 2011 they were elevated at 74. In 2010 she had an evaluation for a right ovarian mass and underwent a right ovarian cystectomy that revealed endometriosis. She was also diagnosed with left-sided pelvic adhesions. October 23, 2011 CA 125  was elevated at 55.6. Her last Pap smear was normal in 11/06/11.  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 She does have some breast tenderness week before her cycles.She occasionally has some ovarian discomfort she believes corresponds to ovulation in the right lower quadrant. She has mild cramping with her menstrual cycles. She has a brother pain or pressure. There are no new medical problems and her family. Her mother had Graves' disease and had a thyroidectomy and is currently on Synthroid. She also has fibromyalgia.   She has declined genetic screening. She had a history of anxiety and depression and she has seen a psychiatrist in the past year who prescribed Lexapro. This has improved her symptoms and she noticed weight gain with this (though she had experienced weight loss before this treatment began).  She had an elevated CA 125 when I saw her in  2015, (90's) however, a CT scan in October, 2015 showed no evidence of recurrence. It is normal for her CA 125 values to wax and wane. A CA 125 1 month ago was 75.  She saw Dr Sumner Boast on 04/10/15 for a symptomatic UTI. During pelvic exam a mass was  appreciated on bimanual exam. A TVUS was ordered and revealed a surgically absent left ovary. The right ovary contained a 2.8 x 1.9 cm echogenic mass with no internal blood flow, homogeneous throughout. It was felt to represent either a hemorrhagic cyst versus dermoid versus an endometrioma. No free fluid was seen. She is otherwise asymptomatic.  Interval History:  On 05/31/15 she underwent repeat CA 125 draw which was elevated to 658 (consistent rise from 26 in 2014, to 94 in 2015, now to 658). [place]  On 05/31/15 showed a right ovary with a cystic mass measuring 24x21x72mm with low level echoes, avascular, smooth borders.  CT scan of the abdomen and pelvis on 06/07/15 showed 3.3cm right ovarian cyst which overall has benign features. No extraovarian disease was identified.  Review of Systems:   Constitutional: Feeling well Skin: No rash, sores, jaundice, itching, or dryness.  Cardiovascular: No chest pain, shortness of breath, or edema  Pulmonary: No cough or wheeze.  Gastro Intestinal: No nausea, vomiting, constipation, or diarrhea reported. No bright red blood per rectum or change in bowel movement.  Genitourinary: No frequency, urgency, or dysuria.  Denies vaginal bleeding and discharge.  Musculoskeletal: No myalgia, arthralgia, joint swelling or pain.  Neurologic: No weakness, numbness, or change in gait.  Psychology: No issues .  Current Meds:  Outpatient Encounter Prescriptions as of 06/11/2015  Medication Sig  . acetaminophen (TYLENOL) 325 MG tablet Take 650 mg by mouth every 6 (six) hours as needed for mild pain or headache.   . Cholecalciferol (VITAMIN D3) 3000 UNITS TABS Take 1 tablet by mouth daily.   Marland Kitchen etonogestrel (NEXPLANON) 68 MG IMPL implant 1 each by Subdermal route once.  . [DISCONTINUED] buPROPion (WELLBUTRIN XL) 150 MG 24 hr tablet TAKE 1 TABLET BY MOUTH ONCE EVERY MORNING, 300 MG Daily  . [DISCONTINUED] DULoxetine (CYMBALTA) 60 MG capsule Take 60 mg by mouth daily.  .  [DISCONTINUED] Nutritional Supplements (NUTRITIONAL SHAKE PO) Take by mouth daily. Reported on 04/23/2015   No facility-administered encounter medications on file as of 06/11/2015.    Allergy:  Allergies  Allergen Reactions  . Amoxicillin     Has patient had a PCN reaction causing immediate rash, facial/tongue/throat swelling, SOB or lightheadedness with hypotension: No Has patient had a PCN reaction causing severe rash involving mucus membranes or skin necrosis: No Has patient had a PCN reaction that required hospitalization No Has patient had a PCN reaction occurring within the last 10 years:NO If all of the above answers are "NO", then may proceed with Cephalosporin use.  . Aspirin     Throat gets itchy  . Nsaids Itching    Itchy throat and gums, throat closes up; palpitations     Social Hx:   Social History   Social History  . Marital Status: Single    Spouse Name: N/A  . Number of Children: N/A  . Years of Education: N/A   Occupational History  . Not on file.   Social History Main Topics  . Smoking status: Never Smoker   . Smokeless tobacco: Never Used  . Alcohol Use: 0.6 - 1.2 oz/week    1-2 Standard drinks or equivalent per week  Comment: occas  . Drug Use: No  . Sexual Activity: Not Currently   Other Topics Concern  . Not on file   Social History Narrative   [place]   0 children   Exercise-treadmill, weights 1-2 times per week       Past Surgical Hx:  Past Surgical History  Procedure Laterality Date  . Salpingoophorectomy  07/2006    Left for Stage 1A ovarian cancer  . Appendectomy  08/2006  . Bunionectomy  02/2007  . Cystectomy  07/2008    from right ovary  . Wisdom tooth extraction  2015    Past Medical Hx:  Past Medical History  Diagnosis Date  . Dizziness   . Migraine     with aura around menses  . Anxiety   . Ovarian cancer ([place]) 2008    Stage 1A    Family Hx:  Family History  Problem  Relation Age of Onset  . Diabetes Maternal Aunt   . Heart attack Maternal Grandfather   . Heart disease Maternal Grandfather     Heart attack; CAD   . Fibromyalgia Mother   . Thyroid disease Mother   . Blindness Father   . Alcohol abuse Father     Vitals:  Blood pressure 123/81, pulse 81, temperature 97.3 F (36.3 C), temperature source Oral, resp. rate 16, height 4' 11.5" (1.511 m), weight 128 lb 12.8 oz (58.423 kg), last menstrual period 05/31/2015, SpO2 100 %.  Physical Exam: Well-nourished well-developed female in no acute distress.  Neck: Supple, no lymphadenopathy, no family.  Lungs: Clear to auscultation bilaterally.  Cardiovascular: Regular rate and rhythm.  Breasts: deferred  Abdomen: [place]. Well-healed vertical midline incision.There is no evidence of any incisional hernias. Abdomen is soft and nontender.  Groins: No lymphadenopathy.  Extremities: No edema.  Pelvic: Normal external female genitalia. The vagina is well epithelialized. The cervix is visualized. There's no visible lesions. Bimanual examination the uterus is freely mobile. It appears to be retroflexed. There is no adnexal masses. Rectal confirms no nodularity.    [name], MD 06/11/2015, 5:54 PM

## 2015-06-14 NOTE — Transfer of Care (Signed)
Immediate Anesthesia Transfer of Care Note  Patient: Sharon Holmes  Procedure(s) Performed: Procedure(s): ROBOTIC LAPAROSCOPIC RIGHT OVARIAN CYSTECTOMY/ (Right)  Patient Location: PACU  Anesthesia Type:General  Level of Consciousness:  sedated, patient cooperative and responds to stimulation  Airway & Oxygen Therapy:Patient Spontanous Breathing and Patient connected to face mask oxgen  Post-op Assessment:  Report given to PACU RN and Post -op Vital signs reviewed and stable  Post vital signs:  Reviewed and stable  Last Vitals:  Filed Vitals:   06/14/15 0910 06/14/15 1021  BP: 120/80   Pulse: 118 88  Temp: 36.9 C   Resp: 16     Complications: No apparent anesthesia complications

## 2015-06-14 NOTE — Discharge Instructions (Signed)
Ovarian Cystectomy, Care After Refer to this sheet in the next few weeks. These instructions provide you with information on caring for yourself after your procedure. Your health care provider may also give you more specific instructions. Your treatment has been planned according to current medical practices, but problems sometimes occur. Call your health care provider if you have any problems or questions after your procedure.  WHAT TO EXPECT AFTER THE PROCEDURE After your procedure, it is typical to have the following:  Pain in your abdomen, especially at the incision sites. You will be given pain medicines to control the pain.  Tiredness. This is a normal part of the recovery process. Your energy level will return to normal over the next several weeks.  Constipation. HOME CARE INSTRUCTIONS  Only take over-the-counter or prescription medicines as directed by your health care provider. Avoid taking aspirin because it can cause bleeding.   Follow your health care provider's instructions for when to resume your regular diet, exercise, and activities.   Take rest breaks during the day as needed.  Do not douche or have sexual intercourse until you have permission from your health care provider.   Remove or change any bandages (dressings) as directed by your health care provider.   Do not drive until your health care provider approves.   Take showers instead of baths until your health care provider tells you otherwise.   If you become constipated, you may:   Use a mild laxative if your health care provider approves.  Add more fruit and bran to your diet.  Drink more fluids.  Take your temperature twice a day and record it.   Do not drink alcohol while taking pain medicine.   Try to have someone home with you for the first 1-2 weeks to help with your household activities.   Follow up with your health care provider as directed. SEEK MEDICAL CARE IF:  You have a fever.    You feel sick to your stomach (nauseous) and throw up (vomit).   You have redness, swelling, or leakage of fluid at the incision site.   You have pain when you urinate or have blood in your urine.   You have a rash on your body.   You have pain or redness where the IV tube was inserted.   You have pain that is not relieved with medicine.  SEEK IMMEDIATE MEDICAL CARE IF:  You have chest pain or shortness of breath.   You feel dizzy or lightheaded.   You have increasing abdominal pain that is not relieved with medicines.   You have pain, swelling, or redness in your leg.   You see a yellowish white fluid (pus) coming from the incision.   Your incision is opening (edges not staying together).    This information is not intended to replace advice given to you by your health care provider. Make sure you discuss any questions you have with your health care provider.   Document Released: 12/15/2012 Document Reviewed: 12/15/2012 Elsevier Interactive Patient Education [employer].

## 2015-06-14 NOTE — Anesthesia Procedure Notes (Signed)
Procedure Name: Intubation Date/Time: 06/14/2015 12:43 PM Performed by: Lajuana Carry E Pre-anesthesia Checklist: Patient identified, Emergency Drugs available, Suction available and Patient being monitored Patient Re-evaluated:Patient Re-evaluated prior to inductionOxygen Delivery Method: Circle System Utilized Preoxygenation: Pre-oxygenation with 100% oxygen Intubation Type: IV induction Ventilation: Mask ventilation without difficulty Laryngoscope Size: Miller and 2 Grade View: Grade I Tube type: Oral Tube size: 7.0 mm Number of attempts: 1 Airway Equipment and Method: Stylet and Oral airway Placement Confirmation: ETT inserted through vocal cords under direct vision,  positive ETCO2 and breath sounds checked- equal and bilateral Secured at: 22 cm Tube secured with: Tape Dental Injury: Teeth and Oropharynx as per pre-operative assessment

## 2015-06-14 NOTE — Interval H&P Note (Signed)
History and Physical Interval Note:  06/14/2015 12:31 PM  Sharon Holmes  has presented today for surgery, with the diagnosis of ovarian cyst  The various methods of treatment have been discussed with the patient and family. After consideration of risks, benefits and other options for treatment, the patient has consented to  Procedure(s): ROBOTIC [place] as a surgical intervention .  The patient's history has been reviewed, patient examined, no change in status, stable for surgery.  I have reviewed the patient's chart and labs.  Questions were answered to the patient's satisfaction.  The patient discussed with me that even in the event that this cyst is cancer, she does not desire oophorectomy at the time of surgery. She was informed that oophorectomy is indicated and recommended (and associated with better prognosis) if this is malignant. She was informed that failure to perform oophorectomy (and hysterectomy) during this procedure if a malignancy is identified may result in her requiring a subsequent surgery to complete the oophorectomy. She expressed understanding of this but firmly prefers for no oophorectomy because she is concerned regarding surgical menopause.  [name]

## 2015-06-14 NOTE — Anesthesia Postprocedure Evaluation (Signed)
Anesthesia Post Note  Patient: Sharon Holmes  Procedure(s) Performed: Procedure(s) (LRB): ROBOTIC LAPAROSCOPIC RIGHT OVARIAN CYSTECTOMY/ (Right)  Patient location during evaluation: PACU Anesthesia Type: General Level of consciousness: awake and alert Pain management: pain level controlled Vital Signs Assessment: post-procedure vital signs reviewed and stable Respiratory status: spontaneous breathing, nonlabored ventilation, respiratory function stable and patient connected to nasal cannula oxygen Cardiovascular status: blood pressure returned to baseline and stable Postop Assessment: no signs of nausea or vomiting Anesthetic complications: no    Last Vitals:  Filed Vitals:   06/14/15 1455 06/14/15 1504  BP: 118/79 112/74  Pulse: 108   Temp: 36.6 C 36.7 C  Resp: 14     Last Pain:  Filed Vitals:   06/14/15 1515  PainSc: 5                  Montez Hageman

## 2015-06-14 NOTE — Interval H&P Note (Signed)
History and Physical Interval Note:  06/14/2015 10:30 AM  Sharon Holmes  has presented today for surgery, with the diagnosis of ovarian cyst  The various methods of treatment have been discussed with the patient and family. After consideration of risks, benefits and other options for treatment, the patient has consented to  Procedure(s): ROBOTIC LAPAROSCOPIC RIGHT OVARIAN CYSTECTOMY/POSSIBLE TOTAL HYSTERECTOMY/POSSIBLE RIGHT SALPINGO OOPHERECTOMY (Right) POSSIBLE ROBOTIC LAPAROSCOPIC ASSISTED VAGINAL HYSTERECTOMY WITH RIGHT SALPINGO OOPHORECTOMY (Right) as a surgical intervention .  The patient's history has been reviewed, patient examined, no change in status, stable for surgery.  I have reviewed the patient's chart and labs.  Questions were answered to the patient's satisfaction.     [name]

## 2015-06-14 NOTE — Anesthesia Preprocedure Evaluation (Addendum)
Anesthesia Evaluation  Patient identified by MRN, date of birth, ID band Patient awake    Reviewed: Allergy & Precautions, NPO status , Patient's Chart, lab work & pertinent test results  Airway Mallampati: II  TM Distance: >3 FB Neck ROM: Full    Dental no notable dental hx.    Pulmonary neg pulmonary ROS,    Pulmonary exam normal breath sounds clear to auscultation       Cardiovascular negative cardio ROS Normal cardiovascular exam Rhythm:Regular Rate:Normal     Neuro/Psych negative neurological ROS  negative psych ROS   GI/Hepatic negative GI ROS, Neg liver ROS,   Endo/Other  negative endocrine ROS  Renal/GU negative Renal ROS  negative genitourinary   Musculoskeletal negative musculoskeletal ROS (+)   Abdominal   Peds negative pediatric ROS (+)  Hematology negative hematology ROS (+)   Anesthesia Other Findings   Reproductive/Obstetrics negative OB ROS                             Anesthesia Physical Anesthesia Plan  ASA: I  Anesthesia Plan: General   Post-op Pain Management:    Induction: Intravenous  Airway Management Planned: Oral ETT  Additional Equipment:   Intra-op Plan:   Post-operative Plan: Extubation in OR  Informed Consent: I have reviewed the patients History and Physical, chart, labs and discussed the procedure including the risks, benefits and alternatives for the proposed anesthesia with the patient or authorized representative who has indicated his/her understanding and acceptance.   Dental advisory given  Plan Discussed with: CRNA  Anesthesia Plan Comments:         Anesthesia Quick Evaluation  

## 2015-06-18 ENCOUNTER — Telehealth: Payer: Self-pay

## 2015-06-18 NOTE — Telephone Encounter (Signed)
Orders received from [facility] to contact the patient with surgical pathology report "negative" for cancer. Patient contacted to update with surgical pathology report being "neg" for cancer . Also following up on how she is doing postoperatively . Patient states she doing well and denies any problems , patient states understanding of pathology report . Patient's post-op follow up appointment was scheduled for may 01 , 2017 per discharge orders .

## 2015-07-09 ENCOUNTER — Encounter: Payer: Self-pay | Admitting: Gynecologic Oncology

## 2015-07-09 ENCOUNTER — Ambulatory Visit: Payer: BC Managed Care – PPO | Attending: Gynecologic Oncology | Admitting: Gynecologic Oncology

## 2015-07-09 DIAGNOSIS — Z79899 Other long term (current) drug therapy: Secondary | ICD-10-CM | POA: Insufficient documentation

## 2015-07-09 DIAGNOSIS — Z8543 Personal history of malignant neoplasm of ovary: Secondary | ICD-10-CM | POA: Insufficient documentation

## 2015-07-09 DIAGNOSIS — N83201 Unspecified ovarian cyst, right side: Secondary | ICD-10-CM

## 2015-07-09 DIAGNOSIS — Z8249 Family history of ischemic heart disease and other diseases of the circulatory system: Secondary | ICD-10-CM | POA: Diagnosis not present

## 2015-07-09 DIAGNOSIS — K219 Gastro-esophageal reflux disease without esophagitis: Secondary | ICD-10-CM | POA: Insufficient documentation

## 2015-07-09 DIAGNOSIS — N801 Endometriosis of ovary: Secondary | ICD-10-CM | POA: Diagnosis not present

## 2015-07-09 DIAGNOSIS — Z9889 Other specified postprocedural states: Secondary | ICD-10-CM | POA: Insufficient documentation

## 2015-07-09 DIAGNOSIS — M797 Fibromyalgia: Secondary | ICD-10-CM | POA: Insufficient documentation

## 2015-07-09 DIAGNOSIS — F329 Major depressive disorder, single episode, unspecified: Secondary | ICD-10-CM | POA: Insufficient documentation

## 2015-07-09 DIAGNOSIS — Z888 Allergy status to other drugs, medicaments and biological substances status: Secondary | ICD-10-CM | POA: Insufficient documentation

## 2015-07-09 DIAGNOSIS — G43909 Migraine, unspecified, not intractable, without status migrainosus: Secondary | ICD-10-CM | POA: Diagnosis not present

## 2015-07-09 DIAGNOSIS — N390 Urinary tract infection, site not specified: Secondary | ICD-10-CM | POA: Insufficient documentation

## 2015-07-09 DIAGNOSIS — F419 Anxiety disorder, unspecified: Secondary | ICD-10-CM | POA: Insufficient documentation

## 2015-07-09 DIAGNOSIS — Z880 Allergy status to penicillin: Secondary | ICD-10-CM | POA: Diagnosis not present

## 2015-07-09 DIAGNOSIS — C562 Malignant neoplasm of left ovary: Secondary | ICD-10-CM

## 2015-07-09 DIAGNOSIS — N809 Endometriosis, unspecified: Secondary | ICD-10-CM | POA: Diagnosis not present

## 2015-07-09 DIAGNOSIS — N736 Female pelvic peritoneal adhesions (postinfective): Secondary | ICD-10-CM | POA: Diagnosis not present

## 2015-07-09 DIAGNOSIS — N80109 Endometriosis of ovary, unspecified side, unspecified depth: Secondary | ICD-10-CM

## 2015-07-09 NOTE — Patient Instructions (Signed)
You should follow-up with your gynecologist in 6 months and with Dr Denman George in 12 months. You are cleared for work in 24 hours. You are cleared from all activity in 2 weeks.

## 2015-07-09 NOTE — Progress Notes (Signed)
Follow-up Note: Gyn-Onc  Emerlyn Holmes 35 y.o. female  CC:  Chief Complaint  Patient presents with  . Follow-up    postop from ovarian cystectomy    Assessment/Plan: 35 year old with a stage IA unknown histology ovarian carcinoma diagnosed and treated in 2008.  Endometriosis and history of right ovarian cyst (March, 2017).  She is doing well postop. Given findings of endometriosis at time of her surgery, recommend continued ovulatory suppression with nexplanon.  Continue ongoing surveillance with annual visits with me (next in May 2018). She should return to see Dr Talbert [name] in 6 months (November, 2017).  HPI:   35 year old gravida 0 (DES grand daughter) in 2008 began feeling some fatigue bloating and was having some bleeding every week. She was told her that she might have polycystic ovarian syndrome. She then developed some sharp left-sided shoulder pain went to her local emergency room where an MRI was performed it revealed a 17 cm ovarian mass. She underwent exploratory laparotomy left salpingo-oophorectomy that revealed a this early stage ovarian cancer in 2008. She does not know the histology and we do not have a record of her final pathology from that surgery. After that she was referred to Dr. Susa Day a gynecologic oncologist in [place]. She underwent complete staging at that time (that pathology is availalble) which was completely negative (including appendectomy) and she was felt to have a stage IA ovarian cancer was dispositioned to close followup. Her CA 125 was elevated at the time of her diagnosis and has been followed closely. Her CA-125 has been elevated from time to time and most recently in October 2011 they were elevated at 74. In 2010 she had an evaluation for a right ovarian mass and underwent a right ovarian cystectomy that revealed endometriosis. She was also diagnosed with left-sided pelvic adhesions. October 23, 2011 CA 125  was elevated at 55.6. Her last Pap smear  was normal in 11/06/11.  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 She does have some breast tenderness week before her cycles.She occasionally has some ovarian discomfort she believes corresponds to ovulation in the right lower quadrant. She has mild cramping with her menstrual cycles. She has a brother pain or pressure. There are no new medical problems and her family. Her mother had Graves' disease and had a thyroidectomy and is currently on Synthroid. She also has fibromyalgia.   She has declined genetic screening. She had a history of anxiety and depression and she has seen a psychiatrist in the past year who prescribed Lexapro. This has improved her symptoms and she noticed weight gain with this (though she had experienced weight loss before this treatment began).  She had an elevated CA 125 when I saw her in 2015, (90's) however, a CT scan in October, 2015 showed no evidence of recurrence. It is normal for her CA 125 values to wax and wane. A CA 125 1 month ago was 75.  She saw Dr Sumner Boast on 04/10/15 for a symptomatic UTI. During pelvic exam a mass was appreciated on bimanual exam. A TVUS was ordered and revealed a surgically absent left ovary. The right ovary contained a 2.8 x 1.9 cm echogenic mass with no internal blood flow, homogeneous throughout. It was felt to represent either a hemorrhagic cyst versus dermoid versus an endometrioma. No free  fluid was seen. She is otherwise asymptomatic. On 05/31/15 she underwent repeat CA 125 draw which was elevated to 658 (consistent rise from 26 in 2014, to 94 in 2015, now to 658). [place]  On 05/31/15 showed a right ovary with a cystic mass measuring 24x21x39mm with low level echoes, avascular, smooth borders.  CT scan of the abdomen and pelvis on 06/07/15 showed 3.3cm right ovarian cyst  which overall has benign features. No extraovarian disease was identified.  Interval History:  On 06/14/15 she underwent robotic assisted right ovarian cystectomy. Intraoperative findings include:d an enlarged (6cm) right ovary with dominant cyst (chocolate fluid filled) which was an endometrioma on frozen section. There were multiple (approximately 6) 1-2cm simple cysts in the ovary also removed. All unavoidably ruptured during extraction. There was no evidence of recurrent cancer in the peritoneal cavity (normal uterus, surgically absent left tube and ovary, normal omentum, normal peritoneum, normal diaphragms). Surgically absent appendix. There were endoemtriosis powderburn lesions on the posterior uterus.  Final pathology revealed: benign serous cystadenomas of the right ovary. No malignancy.  Review of Systems:   Constitutional: Feeling well Skin: No rash, sores, jaundice, itching, or dryness.  Cardiovascular: No chest pain, shortness of breath, or edema  Pulmonary: No cough or wheeze.  Gastro Intestinal: No nausea, vomiting, constipation, or diarrhea reported. No bright red blood per rectum or change in bowel movement.  Genitourinary: No frequency, urgency, or dysuria.  Denies vaginal bleeding and discharge.  Musculoskeletal: No myalgia, arthralgia, joint swelling or pain.  Neurologic: No weakness, numbness, or change in gait.  Psychology: No issues .  Current Meds:  Outpatient Encounter Prescriptions as of 07/09/2015  Medication Sig  . buPROPion (WELLBUTRIN XL) 300 MG 24 hr tablet Take 300 mg by mouth every morning.  . Cholecalciferol (VITAMIN D3) 3000 UNITS TABS Take 1 tablet by mouth daily.   Marland Kitchen docusate sodium (COLACE) 100 MG capsule Take 100 mg by mouth daily as needed for mild constipation.  . DULoxetine (CYMBALTA) 30 MG capsule Take 30 mg by mouth daily.  Marland Kitchen etonogestrel (NEXPLANON) 68 MG IMPL implant 1 each by Subdermal route once.  Marland Kitchen oxyCODONE-acetaminophen (PERCOCET) 10-325 MG  tablet Take 1 tablet by mouth every 4 (four) hours as needed for pain.   No facility-administered encounter medications on file as of 07/09/2015.    Allergy:  Allergies  Allergen Reactions  . Amoxicillin     Has patient had a PCN reaction causing immediate rash, facial/tongue/throat swelling, SOB or lightheadedness with hypotension: No Has patient had a PCN reaction causing severe rash involving mucus membranes or skin necrosis: No Has patient had a PCN reaction that required hospitalization No Has patient had a PCN reaction occurring within the last 10 years:NO If all of the above answers are "NO", then may proceed with Cephalosporin use. Severe leg cramps and high fever  . Aspirin     Throat gets itchy  . Nsaids Itching    Itchy throat and gums, throat closes up; palpitations     Social Hx:   Social History   Social History  . Marital Status: Single    Spouse Name: N/A  . Number of Children: N/A  . Years of Education: N/A   Occupational History  . Not on file.   Social History Main Topics  . Smoking status: Never Smoker   . Smokeless tobacco: Never Used  . Alcohol Use: 0.6 - 1.2 oz/week    1-2 Standard drinks or equivalent per week     Comment: occas  .  Drug Use: No  . Sexual Activity: Not Currently   Other Topics Concern  . Not on file   Social History Narrative   [place]   0 children   Exercise-treadmill, weights 1-2 times per week       Past Surgical Hx:  Past Surgical History  Procedure Laterality Date  . Salpingoophorectomy  07/2006    Left for Stage 1A ovarian cancer  . Appendectomy  08/2006  . Bunionectomy  02/2007  . Cystectomy  07/2008    from right ovary  . Wisdom tooth extraction  2015  . Robotic assisted laparoscopic ovarian cystectomy Right 06/14/2015    Procedure: ROBOTIC LAPAROSCOPIC RIGHT OVARIAN CYSTECTOMY/;  Surgeon: Everitt Amber, MD;  Location: [facility];  Service: Gynecology;  Laterality: Right;     Past Medical Hx:  Past Medical History  Diagnosis Date  . Dizziness   . Migraine     with aura around menses  . Anxiety   . Ovarian cancer ([place]) 2008    Stage 1A  . GERD (gastroesophageal reflux disease)   . Depression     Family Hx:  Family History  Problem Relation Age of Onset  . Diabetes Maternal Aunt   . Heart attack Maternal Grandfather   . Heart disease Maternal Grandfather     Heart attack; CAD   . Fibromyalgia Mother   . Thyroid disease Mother   . Blindness Father   . Alcohol abuse Father     Vitals:  There were no vitals taken for this visit.  Physical Exam: Well-nourished well-developed female in no acute distress.  Neck: Supple, no lymphadenopathy, no family.  Lungs: Clear to auscultation bilaterally.  Cardiovascular: Regular rate and rhythm.  Breasts: deferred  Abdomen: [place]. Well-healed laparoscopic incisions.There is no evidence of any incisional hernias. Abdomen is soft and nontender.  Groins: No lymphadenopathy.  Extremities: No edema.  Pelvic: deferred    [name], MD 07/09/2015, 9:01 AM

## 2015-11-08 ENCOUNTER — Encounter: Payer: Self-pay | Admitting: Obstetrics and Gynecology

## 2015-11-08 ENCOUNTER — Ambulatory Visit (INDEPENDENT_AMBULATORY_CARE_PROVIDER_SITE_OTHER): Payer: BC Managed Care – PPO | Admitting: Obstetrics and Gynecology

## 2015-11-08 ENCOUNTER — Telehealth: Payer: Self-pay | Admitting: Obstetrics and Gynecology

## 2015-11-08 VITALS — BP 100/60 | HR 84 | Resp 16 | Ht 59.5 in | Wt 127.0 lb

## 2015-11-08 DIAGNOSIS — N921 Excessive and frequent menstruation with irregular cycle: Secondary | ICD-10-CM | POA: Diagnosis not present

## 2015-11-08 DIAGNOSIS — Z975 Presence of (intrauterine) contraceptive device: Secondary | ICD-10-CM

## 2015-11-08 LAB — CBC
HEMATOCRIT: 39.5 % (ref 35.0–45.0)
HEMOGLOBIN: 13.4 g/dL (ref 11.7–15.5)
MCH: 31.1 pg (ref 27.0–33.0)
MCHC: 33.9 g/dL (ref 32.0–36.0)
MCV: 91.6 fL (ref 80.0–100.0)
MPV: 10.9 fL (ref 7.5–12.5)
Platelets: 289 10*3/uL (ref 140–400)
RBC: 4.31 MIL/uL (ref 3.80–5.10)
RDW: 14.1 % (ref 11.0–15.0)
WBC: 9.8 10*3/uL (ref 3.8–10.8)

## 2015-11-08 LAB — POCT URINE PREGNANCY: Preg Test, Ur: NEGATIVE

## 2015-11-08 LAB — FERRITIN: Ferritin: 17 ng/mL (ref 10–154)

## 2015-11-08 LAB — TSH: TSH: 2.73 mIU/L

## 2015-11-08 MED ORDER — ESTRADIOL 1 MG PO TABS
1.0000 mg | ORAL_TABLET | Freq: Every day | ORAL | 0 refills | Status: DC
Start: 2015-11-08 — End: 2016-04-01

## 2015-11-08 NOTE — Progress Notes (Signed)
GYNECOLOGY  VISIT   HPI: 35 y.o.   Single  Caucasian  female   [name] with Patient's last menstrual period was 11/08/2015.   here for Irregular Bleeding with the nexplanon. She has a h/o ovarian cancer with LSO, recently she had right ovarian cystectomy for an endometrioma and serous cystadenoma with Dr Denman George. She still has her right ovary.  She had a nexplanon inserted in 3/17. No bleeding for the first couple of months. She started bleeding in the early summer, it was heavy and lasted normal than normal (over a week). Since then she has basically bleed every other week for a week. Heavier than her prior cycles. She can saturate a pad in 6 hours (used to be very light). She has had slight cramping, mostly tolerable, 2/10 in severity. Prior to the nexplanon she wasn't using contraception. Can't use OCP's secondary to migraines with auras.  Not currently sexually active, has been in the past. Nexplanon for contraception.   GYNECOLOGIC HISTORY: Patient's last menstrual period was 11/08/2015. Contraception: Nexplanon  Menopausal hormone therapy: None        OB History    Gravida Para Term Preterm AB Living   0 0 0 0 0 0   SAB TAB Ectopic Multiple Live Births   0 0 0 0           Patient Active Problem List   Diagnosis Date Noted  . Cyst of right ovary   . Endometriosis of ovary   . Migraines 02/20/2015  . Ovarian cancer ([place]) 11/06/2011    Past Medical History:  Diagnosis Date  . Anxiety   . Depression   . Dizziness   . GERD (gastroesophageal reflux disease)   . Migraine    with aura around menses  . Ovarian cancer ([place]) 2008   Stage 1A    Past Surgical History:  Procedure Laterality Date  . APPENDECTOMY  08/2006  . BUNIONECTOMY  02/2007  . CYSTECTOMY  07/2008   from right ovary  . ROBOTIC ASSISTED LAPAROSCOPIC OVARIAN CYSTECTOMY Right 06/14/2015   Procedure: ROBOTIC LAPAROSCOPIC RIGHT OVARIAN CYSTECTOMY/;  Surgeon: Everitt Amber, MD;  Location: [facility];  Service: Gynecology;   Laterality: Right;  . SALPINGOOPHORECTOMY  07/2006   Left for Stage 1A ovarian cancer  . WISDOM TOOTH EXTRACTION  2015    Current Outpatient Prescriptions  Medication Sig Dispense Refill  . buPROPion (WELLBUTRIN XL) 300 MG 24 hr tablet Take 300 mg by mouth every morning.    . Cholecalciferol (VITAMIN D3) 3000 UNITS TABS Take 1 tablet by mouth daily.     Marland Kitchen docusate sodium (COLACE) 100 MG capsule Take 100 mg by mouth daily as needed for mild constipation.    . DULoxetine (CYMBALTA) 60 MG capsule Take 60 mg by mouth every morning.  2  . etonogestrel (NEXPLANON) 68 MG IMPL implant 1 each by Subdermal route once.    . Loratadine (CLARITIN) 10 MG CAPS Take by mouth.    . estradiol (ESTRACE) 1 MG tablet Take 1 tablet (1 mg total) by mouth daily. 7 tablet 0   No current facility-administered medications for this visit.      ALLERGIES: Amoxicillin; Aspirin; and Nsaids  Family History  Problem Relation Age of Onset  . Diabetes Maternal Aunt   . Heart attack Maternal Grandfather   . Heart disease Maternal Grandfather     Heart attack; CAD   . Fibromyalgia Mother   . Thyroid disease Mother   . Blindness Father   .  Alcohol abuse Father     Social History   Social History  . Marital status: Single    Spouse name: N/A  . Number of children: N/A  . Years of education: N/A   Occupational History  . Not on file.   Social History Main Topics  . Smoking status: Never Smoker  . Smokeless tobacco: Never Used  . Alcohol use 0.6 - 1.2 oz/week    1 - 2 Standard drinks or equivalent per week     Comment: occas  . Drug use: No  . Sexual activity: Not Currently   Other Topics Concern  . Not on file   Social History Narrative   [place]   0 children   Exercise-treadmill, weights 1-2 times per week       Review of Systems  Constitutional: Negative.   HENT: Negative.   Eyes: Negative.   Respiratory: Negative.   Cardiovascular: Negative.    Gastrointestinal: Negative.   Genitourinary:       Excessive Bleeding Menstrual Changes Unscheduled bleeding   Musculoskeletal: Negative.   Skin: Negative.   Neurological: Negative.   Endo/Heme/Allergies: Negative.   Psychiatric/Behavioral: Negative.     PHYSICAL EXAMINATION:    BP 100/60 (BP Location: Right Arm, Patient Position: Sitting, Cuff Size: Normal)   Pulse 84   Resp 16   Ht 4' 11.5" (1.511 m)   Wt 127 lb (57.6 kg)   LMP 11/08/2015   BMI 25.22 kg/m     General appearance: alert, cooperative and appears stated age Abdomen: soft, non-tender; bowel sounds normal; no masses,  no organomegaly  Pelvic: External genitalia:  no lesions              Urethra:  normal appearing urethra with no masses, tenderness or lesions              Bartholins and Skenes: normal                 Vagina: normal appearing vagina with normal color and discharge, no lesions, moderate amount of blood              Cervix: no lesions              Bimanual Exam:  Uterus:  normal size, contour, position, consistency, mobility, non-tender              Adnexa: no mass, fullness, tenderness  Uncomfortable with the exam in general, used a pediatric speculum, not tender on BM exam.                Chaperone was present for exam.  ASSESSMENT Abnormal bleeding with the nexplanon    PLAN She has an allergy to NSAID's, can't take OCP's secondary to migraine with aura Will try a 1 week course of oral estrogen to see if that helps If bleeding persists will have her return for an ultrasound She is due for f/u in 10/17 (assume she will need an ultrasound and CA 125, will confirm with Dr Denman George)   An After Visit Summary was printed and given to the patient.  15 minutes face to face time of which over 50% was spent in counseling.

## 2015-11-08 NOTE — Telephone Encounter (Signed)
Returned call to patient. Patient states that she had a Nexplanon placed in 3/17. Patient states she had a period the end of March, but did not have a period for a couple of months. Patient states earlier in the summer, she started having a "heavy period every other week." She states she changes her pad/tampon every other time she goes to the bathroom, but that she has noticed she has a lot of blood clots. 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 Per Dr. Talbert [name], patient okay to be added on to afternoon schedule. Appointment made for 11/08/15 at 1600, but patient will arrive after school and aware she will be a work in patient.   Routing to provider for final review. Patient agreeable to disposition. Will close encounter.

## 2015-11-08 NOTE — Telephone Encounter (Signed)
Patient called requesting an appointment with Dr. Talbert [name] for "heavy bleeding" on her cycle with Nexplanon. She declined an appointment this morning.  Routing to triage for further assessment.  Last seen 05/31/15

## 2016-03-20 ENCOUNTER — Telehealth: Payer: Self-pay | Admitting: Obstetrics and Gynecology

## 2016-03-20 NOTE — Telephone Encounter (Signed)
Spoke with patient. Advised reviewed with Dr.Jertson. Advised patient she may be seen tomorrow with another provider if that works better with her schedule or Dr.Jertson would be happy to see her early next week. Patient would like to see Dr.Jertson next week. Appointment for PUS scheduled for 03/25/2016 at 12:30 pm with 1 pm consult with Dr.Jertson. Patient is agreeable to date and time. Advised if bleeding increases to having to change her tampon every 1 due to bleeding through for more than 2 hours or develops any new symptoms she will need to be seen for immediate medical evaluation with our office or ER. Patient verbalizes understanding.  Routing to provider for final review. Patient agreeable to disposition. Will close encounter.

## 2016-03-20 NOTE — Telephone Encounter (Signed)
Spoke with patient. Patient has a nexplanon in place. Reports she had a menses that lasted 2-3 weeks which ended on 03/12/2016. Patient started bleeding again yesterday. Is changing her tampon every 2 hours. Is very fatigued and sleeping more than usual. Also has been having a mild headache off an on. Denies any vision changes, dizziness, weakness, or SOB. Patient has a history of ovarian cancer and is being followed by Dr.Rossi. Patient was last seen in the office on 11/08/2015 with Dr.Jertson for irregular bleeding with nexplanon. Per note patient to have PUS if irregular bleeding continues. Offered patient an appointment today at 1 pm for PUS and consult at 1:45 pm with Dr.Jertson, but patient declines. Patient is at a conference and cannot be seen until 4 pm. Offered patient an appointment tomorrow with another provider or early next week with Dr.Jertson. Patient does not wish to see another MD and feels uncomfortable waiting until next week. Advised I will review with Dr.Jertson and return call with further recommendations. Patient is agreeable.

## 2016-03-20 NOTE — Telephone Encounter (Signed)
Patient is having heavy bleeding again and was told to call and schedule with Dr.Jertson if this occurred again.

## 2016-03-24 ENCOUNTER — Telehealth: Payer: Self-pay | Admitting: Obstetrics and Gynecology

## 2016-03-24 NOTE — Telephone Encounter (Signed)
Spoke with patient in regards to rescheduling ultrasound appointment from 03/25/16. Patient is agreeable and has rescheduled the recommended ultrasound on 03/27/16 with Dr Talbert [name]. Patient has no further questions.  Routing to Dr Talbert [name]  cc: Rolla Etienne

## 2016-03-24 NOTE — Telephone Encounter (Signed)
Left message requesting return call from patient . Ultrasound appointment on 03/25/16 has been canceled, we will need to reschedule on 03/27/16.

## 2016-03-25 ENCOUNTER — Other Ambulatory Visit: Payer: Self-pay

## 2016-03-25 ENCOUNTER — Other Ambulatory Visit: Payer: BC Managed Care – PPO | Admitting: Obstetrics and Gynecology

## 2016-03-25 ENCOUNTER — Other Ambulatory Visit: Payer: BC Managed Care – PPO

## 2016-03-25 DIAGNOSIS — Z975 Presence of (intrauterine) contraceptive device: Principal | ICD-10-CM

## 2016-03-25 DIAGNOSIS — N921 Excessive and frequent menstruation with irregular cycle: Secondary | ICD-10-CM

## 2016-03-27 ENCOUNTER — Other Ambulatory Visit: Payer: BC Managed Care – PPO

## 2016-03-27 ENCOUNTER — Other Ambulatory Visit: Payer: BC Managed Care – PPO | Admitting: Obstetrics and Gynecology

## 2016-04-01 ENCOUNTER — Ambulatory Visit (INDEPENDENT_AMBULATORY_CARE_PROVIDER_SITE_OTHER): Payer: BC Managed Care – PPO

## 2016-04-01 ENCOUNTER — Ambulatory Visit (INDEPENDENT_AMBULATORY_CARE_PROVIDER_SITE_OTHER): Payer: BC Managed Care – PPO | Admitting: Obstetrics and Gynecology

## 2016-04-01 ENCOUNTER — Encounter: Payer: Self-pay | Admitting: Obstetrics and Gynecology

## 2016-04-01 VITALS — BP 108/60 | HR 88 | Resp 16 | Wt 131.0 lb

## 2016-04-01 DIAGNOSIS — Z978 Presence of other specified devices: Secondary | ICD-10-CM | POA: Diagnosis not present

## 2016-04-01 DIAGNOSIS — N939 Abnormal uterine and vaginal bleeding, unspecified: Secondary | ICD-10-CM | POA: Diagnosis not present

## 2016-04-01 DIAGNOSIS — N921 Excessive and frequent menstruation with irregular cycle: Secondary | ICD-10-CM | POA: Diagnosis not present

## 2016-04-01 DIAGNOSIS — Z3046 Encounter for surveillance of implantable subdermal contraceptive: Secondary | ICD-10-CM

## 2016-04-01 DIAGNOSIS — Z975 Presence of (intrauterine) contraceptive device: Principal | ICD-10-CM

## 2016-04-01 NOTE — Progress Notes (Signed)
GYNECOLOGY  VISIT   HPI: 36 y.o.   Single  Caucasian  female   [name] with Patient's last menstrual period was 04/01/2016.   here for  Transvaginal ultrasound  The patient had a nexplanon placed last spring. No bleeding for several months. She started bleeding in the summer, bleed for a good portion of the summer. Bleeding stopped with a course of estrogen, then restarted in December. Since December she has been bleeding most of the time. She just went a week without bleeding and restarted today. The bleeding varies from light to very heavy. Can't wear tampons now because they are uncomfortable. Can saturated a pad in up to 2 hours. She c/o fatigue, not dizzy or light headed.  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 Very stressful. Plans to look for another job, hopefully in middle school.  Not sexually active.  Prior to the nexplanon her cycles were normal. She has no plans to have children. She has a h/o ovarian cancer, she has had a LSO and is followed by Dr Denman George.   GYNECOLOGIC HISTORY: Patient's last menstrual period was 04/01/2016. Contraception:Nexplanon Menopausal hormone therapy: n/a        OB History    Gravida Para Term Preterm AB Living   0 0 0 0 0 0   SAB TAB Ectopic Multiple Live Births   0 0 0 0           Patient Active Problem List   Diagnosis Date Noted  . Cyst of right ovary   . Endometriosis of ovary   . Migraines 02/20/2015  . Ovarian cancer ([place]) 11/06/2011    Past Medical History:  Diagnosis Date  . Anxiety   . Depression   . Dizziness   . GERD (gastroesophageal reflux disease)   . Migraine    with aura around menses  . Ovarian cancer ([place]) 2008   Stage 1A    Past Surgical History:  Procedure Laterality Date  . APPENDECTOMY  08/2006  . BUNIONECTOMY  02/2007  . CYSTECTOMY  07/2008   from right ovary  . ROBOTIC ASSISTED LAPAROSCOPIC OVARIAN CYSTECTOMY Right 06/14/2015   Procedure: ROBOTIC LAPAROSCOPIC  RIGHT OVARIAN CYSTECTOMY/;  Surgeon: Everitt Amber, MD;  Location: [facility];  Service: Gynecology;  Laterality: Right;  . SALPINGOOPHORECTOMY  07/2006   Left for Stage 1A ovarian cancer  . WISDOM TOOTH EXTRACTION  2015    Current Outpatient Prescriptions  Medication Sig Dispense Refill  . buPROPion (WELLBUTRIN XL) 300 MG 24 hr tablet Take 300 mg by mouth every morning.    . docusate sodium (COLACE) 100 MG capsule Take 100 mg by mouth daily as needed for mild constipation.    . DULoxetine (CYMBALTA) 60 MG capsule Take 60 mg by mouth every morning.  2  . etonogestrel (NEXPLANON) 68 MG IMPL implant 1 each by Subdermal route once.    . Loratadine (CLARITIN) 10 MG CAPS Take by mouth.     No current facility-administered medications for this visit.      ALLERGIES: Amoxicillin; Aspirin; and Nsaids  Family History  Problem Relation Age of Onset  . Heart attack Maternal Grandfather   . Heart disease Maternal Grandfather     Heart attack; CAD   . Fibromyalgia Mother   . Thyroid disease Mother   . Blindness Father   . Alcohol abuse Father   . Diabetes Maternal Aunt     Social History   Social History  .  Marital status: Single    Spouse name: N/A  . Number of children: N/A  . Years of education: N/A   Occupational History  . Not on file.   Social History Main Topics  . Smoking status: Never Smoker  . Smokeless tobacco: Never Used  . Alcohol use 0.6 - 1.2 oz/week    1 - 2 Standard drinks or equivalent per week     Comment: occas  . Drug use: No  . Sexual activity: Not Currently   Other Topics Concern  . Not on file   Social History Narrative   [place]   0 children   Exercise-treadmill, weights 1-2 times per week       Review of Systems  Constitutional: Negative.   HENT: Negative.   Eyes: Negative.   Respiratory: Negative.   Cardiovascular: Negative.   Gastrointestinal: Negative.   Genitourinary:       Excess  bleeding Menstrual Cycle Changes Unscheduled bleeding or spotting Loss of sexual interest  Musculoskeletal: Negative.   Skin: Negative.   Neurological: Negative.   Endo/Heme/Allergies: Negative.   Psychiatric/Behavioral: Negative.     PHYSICAL EXAMINATION:    BP 108/60 (BP Location: Right Arm, Patient Position: Sitting, Cuff Size: Normal)   Pulse 88   Resp 16   Wt 131 lb (59.4 kg)   LMP 04/01/2016   BMI 26.02 kg/m     General appearance: alert, cooperative and appears stated age  Ultrasound images reviewed with the patient, discussed her thin endometrium. After discussion she decided she wanted the Nexplanon out.  Risks of nexplanon removal and reinsertion were reviewed with the patient and a consent was signed.  The patient was placed in the supine position with her left arm bent at the elbow. The area was cleansed with betadine and injected with 1% lidocaine. A #11 blade was used to incise over the distal end of the nexplanon implant. The implant was grasped with a clamp and removed.   The patients arm was cleansed of betadine and a steri strip was placed over the incision. A gauze was wrapped around her arm.  She tolerated the procedure well  Instructions for care were discussed.     Chaperone was present for exam.  ASSESSMENT Irregular bleeding with the nexplanon, prior to the nexplanon her bleeding was normal.  Not currently sexually active Not a candidate for combination OCP's History of ovarian cancer, s/p LSO, being followed by Dr Denman George    PLAN CBC, Ferritin (not done prior to leaving, will have her return for blood draw) Normal TSH in 8/17 Nexplanon removed  She doesn't currently need contraception, if she does, she would like the Rains IUD    An After Visit Summary was printed and given to the patient.  15 minutes face to face time of which over 50% was spent in counseling.

## 2016-04-03 ENCOUNTER — Telehealth: Payer: Self-pay | Admitting: *Deleted

## 2016-04-03 NOTE — Telephone Encounter (Signed)
Left message to call to schedule labs -eh

## 2016-04-03 NOTE — Telephone Encounter (Signed)
-----   Message from Salvadore Dom, MD sent at 04/01/2016  5:21 PM EST ----- Please call Cielo and ask her to set up a lab appointment. Please apologize for me, I forgot to get her blood work prior to her leaving. The orders are in. Thanks, Sharee Pimple

## 2016-04-09 NOTE — Telephone Encounter (Signed)
Left patient another message to call and schedule lab appointment -eh

## 2016-04-14 NOTE — Telephone Encounter (Signed)
Left patient 2 messages regarding lab scheduling. Patient has not returned call- please advise-

## 2016-04-14 NOTE — Telephone Encounter (Signed)
Will close encounter

## 2016-11-15 ENCOUNTER — Ambulatory Visit (INDEPENDENT_AMBULATORY_CARE_PROVIDER_SITE_OTHER): Payer: Self-pay | Admitting: Family Medicine

## 2016-11-15 VITALS — BP 110/70 | HR 104 | Temp 98.9°F | Resp 18 | Wt 136.0 lb

## 2016-11-15 DIAGNOSIS — J0140 Acute pansinusitis, unspecified: Secondary | ICD-10-CM

## 2016-11-15 MED ORDER — FLUCONAZOLE 150 MG PO TABS: 150.0000 mg | ORAL_TABLET | Freq: Once | ORAL | 0 refills | Status: AC

## 2016-11-15 MED ORDER — DOXYCYCLINE HYCLATE 100 MG PO CAPS: 100.0000 mg | ORAL_CAPSULE | Freq: Two times a day (BID) | ORAL | 0 refills | Status: DC

## 2016-11-15 MED ORDER — PROMETHAZINE-CODEINE 6.25-10 MG/5ML PO SYRP: 5.0000 mL | ORAL_SOLUTION | Freq: Three times a day (TID) | ORAL | 0 refills | Status: DC | PRN

## 2016-11-15 NOTE — Patient Instructions (Signed)

## 2016-11-15 NOTE — Progress Notes (Signed)
Patient ID: Sharon Holmes, female    DOB: 1980-06-03, 36 y.o.   MRN: [id number]  PCP: Haywood Pao, MD  Chief Complaint  Patient presents with  . Sore Throat    x yesterday  . Sinus Problem    sinus pressure; ears clogged; nasal - yellowish x yesterday    Subjective:  HPI Sharon Holmes is a 36 y.o. female presents for evaluation of congestion and cough worsening over 1 day. Sharon Holmes has a history of chronic rhinitis secondary to seasonal allergies which she manages with Claritin as needed. She reports worsening productive, persistent cough, bilateral ear stuffiness, nasal discharge that is thick and yellow, body aches, and chills. Denies associated nausea, vomiting, fever, ear pain, headache, facial pain, or throat soreness. Sharon Holmes reports waxing and waning nasal congestion over the last several weeks for which she has attempted relief with Claritin without relief of congestion. She took Nyquil last night for cough and continued to experience hacking cough throughout the night.  Social History   Social History  . Marital status: Single    Spouse name: N/A  . Number of children: N/A  . Years of education: N/A   Occupational History  . Not on file.   Social History Main Topics  . Smoking status: Never Smoker  . Smokeless tobacco: Never Used  . Alcohol use 0.6 - 1.2 oz/week    1 - 2 Standard drinks or equivalent per week     Comment: occas  . Drug use: No  . Sexual activity: Not Currently   Other Topics Concern  . Not on file   Social History Narrative   [place]   0 children   Exercise-treadmill, weights 1-2 times per week       Family History  Problem Relation Age of Onset  . Heart attack Maternal Grandfather   . Heart disease Maternal Grandfather        Heart attack; CAD   . Fibromyalgia Mother   . Thyroid disease Mother   . Blindness Father   . Alcohol abuse Father   . Diabetes Maternal Aunt    Review of Systems See   Patient Active Problem List   Diagnosis Date Noted  . Cyst of right ovary   . Endometriosis of ovary   . Migraines 02/20/2015  . Ovarian cancer ([place]) 11/06/2011    Allergies  Allergen Reactions  . Amoxicillin     Has patient had a PCN reaction causing immediate rash, facial/tongue/throat swelling, SOB or lightheadedness with hypotension: No Has patient had a PCN reaction causing severe rash involving mucus membranes or skin necrosis: No Has patient had a PCN reaction that required hospitalization No Has patient had a PCN reaction occurring within the last 10 years:NO If all of the above answers are "NO", then may proceed with Cephalosporin use. Severe leg cramps and high fever  . Aspirin     Throat gets itchy  . Nsaids Itching    Itchy throat and gums, throat closes up; palpitations     Prior to Admission medications   Medication Sig Start Date End Date Taking? Authorizing Provider  buPROPion (WELLBUTRIN XL) 300 MG 24 hr tablet Take 300 mg by mouth every morning.   Yes [provider]  docusate sodium (COLACE) 100 MG capsule Take 100 mg by mouth daily as needed for mild constipation.   Yes [provider]  DULoxetine (CYMBALTA) 60 MG capsule Take 60 mg by mouth every morning. 10/21/15  Yes  [provider]  Loratadine (CLARITIN) 10 MG CAPS Take by mouth.   Yes [provider]    Past Medical, Surgical Family and Social History reviewed and updated.    Objective:   Today's Vitals   11/15/16 1045  BP: 110/70  Pulse: (!) 104  Resp: 18  Temp: 98.9 F (37.2 C)  TempSrc: Oral  SpO2: 99%  Weight: 136 lb (61.7 kg)    Wt Readings from Last 3 Encounters:  11/15/16 136 lb (61.7 kg)  04/01/16 131 lb (59.4 kg)  11/08/15 127 lb (57.6 kg)    Physical Exam  Constitutional: She is oriented to person, place, and time.  HENT:  Head: Normocephalic and atraumatic.  Right Ear: Hearing, tympanic membrane, external ear and ear canal normal.  Left  Ear: Hearing, tympanic membrane, external ear and ear canal normal.  Nose: Right sinus exhibits no maxillary sinus tenderness and no frontal sinus tenderness. Left sinus exhibits no maxillary sinus tenderness and no frontal sinus tenderness.  Mouth/Throat: Uvula is midline, oropharynx is clear and moist and mucous membranes are normal.  Thick pus-like discharge bilateral nares. Audible congestion noted   Eyes: Pupils are equal, round, and reactive to light.  Watery eyes  Cardiovascular: Tachycardia present.   Pulmonary/Chest: Effort normal and breath sounds normal. No respiratory distress. She has no wheezes. She has no rales. She exhibits no tenderness.  Neurological: She is alert and oriented to person, place, and time.  Skin: Skin is warm and dry.  Psychiatric: She has a normal mood and affect. Her behavior is normal. Judgment and thought content normal.   Assessment & Plan:  Acute pansinusitis, recurrence not specified  Meds ordered this encounter  Medications  . doxycycline (VIBRAMYCIN) 100 MG capsule    Sig: Take 1 capsule (100 mg total) by mouth 2 (two) times daily.    Dispense:  20 capsule    Refill:  0  . fluconazole (DIFLUCAN) 150 MG tablet    Sig: Take 1 tablet (150 mg total) by mouth once.    Dispense:  1 tablet    Refill:  0  . promethazine-codeine (PHENERGAN WITH CODEINE) 6.25-10 MG/5ML syrup    Sig: Take 5 mLs by mouth every 8 (eight) hours as needed for cough.    Dispense:  120 mL    Refill:  0    Follow-up with PCP if symptoms worsen or do not improve.   Carroll Sage. Kenton Kingfisher, MSN, FNP-C,  [facility], [address]  [place], [place] [address] [phone]

## 2016-11-17 ENCOUNTER — Telehealth (HOSPITAL_COMMUNITY): Payer: Self-pay | Admitting: Emergency Medicine

## 2016-11-17 NOTE — Telephone Encounter (Signed)
Spoke with patient and she state she is feeling better but did come home early from work because she is feeling tired. She said she does notice a difference since she started taking the doxycycline and cough medication that was prescribe for her. Patient thanked me for the call and I notified her to continue to get lots of rest, to drink plenty of fluids, and to cal [place] if she needs anything.

## 2016-12-18 ENCOUNTER — Encounter: Payer: Self-pay | Admitting: Obstetrics and Gynecology

## 2016-12-18 ENCOUNTER — Ambulatory Visit (INDEPENDENT_AMBULATORY_CARE_PROVIDER_SITE_OTHER): Payer: BC Managed Care – PPO | Admitting: Obstetrics and Gynecology

## 2016-12-18 VITALS — BP 104/70 | HR 88 | Resp 14 | Ht 59.5 in | Wt 138.0 lb

## 2016-12-18 DIAGNOSIS — Z8543 Personal history of malignant neoplasm of ovary: Secondary | ICD-10-CM

## 2016-12-18 DIAGNOSIS — Z30019 Encounter for initial prescription of contraceptives, unspecified: Secondary | ICD-10-CM | POA: Diagnosis not present

## 2016-12-18 LAB — POCT URINE PREGNANCY: PREG TEST UR: NEGATIVE

## 2016-12-18 MED ORDER — NORETHINDRONE 0.35 MG PO TABS: 1.0000 | ORAL_TABLET | Freq: Every day | ORAL | 3 refills | Status: DC

## 2016-12-18 NOTE — Progress Notes (Signed)
GYNECOLOGY  VISIT   HPI: 36 y.o.   Single  Caucasian  female   [name] with Patient's last menstrual period was 12/18/2016.   here c/o having a diva cup stuck. She is interested in reducing her cycle. Not sexually active at the moment. 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 Bleed all the time with the nexplanon.  Menses q 28 days x 4-5 days. Can saturate a super tampon every 3 hours. No cramps. No intermenstrual bleeding.  She has a h/o ovarian cancer, had a LSO, followed by Dr Denman George.    GYNECOLOGIC HISTORY: Patient's last menstrual period was 12/18/2016. Contraception: none Menopausal hormone therapy: none        OB History    Gravida Para Term Preterm AB Living   0 0 0 0 0 0   SAB TAB Ectopic Multiple Live Births   0 0 0 0           Patient Active Problem List   Diagnosis Date Noted  . Cyst of right ovary   . Endometriosis of ovary   . Migraines 02/20/2015  . Ovarian cancer ([place]) 11/06/2011    Past Medical History:  Diagnosis Date  . Anxiety   . Depression   . Dizziness   . GERD (gastroesophageal reflux disease)   . Migraine    with aura around menses  . Ovarian cancer ([place]) 2008   Stage 1A    Past Surgical History:  Procedure Laterality Date  . APPENDECTOMY  08/2006  . BUNIONECTOMY  02/2007  . CYSTECTOMY  07/2008   from right ovary  . ROBOTIC ASSISTED LAPAROSCOPIC OVARIAN CYSTECTOMY Right 06/14/2015   Procedure: ROBOTIC LAPAROSCOPIC RIGHT OVARIAN CYSTECTOMY/;  Surgeon: Everitt Amber, MD;  Location: [facility];  Service: Gynecology;  Laterality: Right;  . SALPINGOOPHORECTOMY  07/2006   Left for Stage 1A ovarian cancer  . WISDOM TOOTH EXTRACTION  2015    Current Outpatient Prescriptions  Medication Sig Dispense Refill  . buPROPion (WELLBUTRIN XL) 300 MG 24 hr tablet Take 300 mg by mouth every morning.    . docusate sodium (COLACE) 100 MG capsule Take 100 mg by mouth daily as needed for mild constipation.    . DULoxetine (CYMBALTA) 60 MG capsule Take 60  mg by mouth every morning.  2  . Loratadine (CLARITIN) 10 MG CAPS Take by mouth.     No current facility-administered medications for this visit.      ALLERGIES: Amoxicillin; Aspirin; and Nsaids  Family History  Problem Relation Age of Onset  . Heart attack Maternal Grandfather   . Heart disease Maternal Grandfather        Heart attack; CAD   . Fibromyalgia Mother   . Thyroid disease Mother   . Blindness Father   . Alcohol abuse Father   . Diabetes Maternal Aunt     Social History   Social History  . Marital status: Single    Spouse name: N/A  . Number of children: N/A  . Years of education: N/A   Occupational History  . Not on file.   Social History Main Topics  . Smoking status: Never Smoker  . Smokeless tobacco: Never Used  . Alcohol use 0.6 - 1.2 oz/week    1 - 2 Standard drinks or equivalent per week     Comment: occas  . Drug use: No  . Sexual activity: Not Currently   Other Topics Concern  . Not on file   Social History Narrative   Single  [place]   0 children   Exercise-treadmill, weights 1-2 times per week       Review of Systems  Constitutional: Negative.   HENT: Negative.   Eyes: Negative.   Respiratory: Negative.   Cardiovascular: Negative.   Gastrointestinal: Negative.        Bloating  Genitourinary: Negative.   Musculoskeletal: Negative.   Skin: Negative.   Neurological: Negative.   Endo/Heme/Allergies: Negative.   Psychiatric/Behavioral: Negative.     PHYSICAL EXAMINATION:    BP 104/70 (BP Location: Right Arm, Patient Position: Sitting, Cuff Size: Normal)   Pulse 88   Resp 14   Ht 4' 11.5" (1.511 m)   Wt 138 lb (62.6 kg)   LMP 12/18/2016   BMI 27.41 kg/m     General appearance: alert, cooperative and appears stated age  Pelvic: External genitalia:  no lesions              Urethra:  normal appearing urethra with no masses, tenderness or lesions              Bartholins and Skenes: normal                  Vagina:Diva cup removed  Chaperone was present for exam.  ASSESSMENT Diva cup stuck, removed Contraception Desire to make cycles lighter  H/O ovarian cancer, overdue for a CA 125    PLAN Diva cup removed Discussed options of micronor, depo-provera, mirena or kyleena IUD.  She would like to try the minipill Calendar cycles Discussed that NSAID's could also lighten her cycles.  CA 125   An After Visit Summary was printed and given to the patient.  ~20 minutes face to face time of which over 50% was spent in counseling.

## 2016-12-19 ENCOUNTER — Other Ambulatory Visit: Payer: Self-pay | Admitting: Obstetrics and Gynecology

## 2016-12-19 DIAGNOSIS — R971 Elevated cancer antigen 125 [CA 125]: Secondary | ICD-10-CM

## 2016-12-19 DIAGNOSIS — Z8543 Personal history of malignant neoplasm of ovary: Secondary | ICD-10-CM

## 2016-12-19 LAB — CA 125: Cancer Antigen (CA) 125: 129.2 U/mL — ABNORMAL HIGH (ref 0.0–38.1)

## 2016-12-23 ENCOUNTER — Encounter: Payer: Self-pay | Admitting: Obstetrics and Gynecology

## 2016-12-23 ENCOUNTER — Ambulatory Visit (INDEPENDENT_AMBULATORY_CARE_PROVIDER_SITE_OTHER): Payer: BC Managed Care – PPO

## 2016-12-23 ENCOUNTER — Ambulatory Visit (INDEPENDENT_AMBULATORY_CARE_PROVIDER_SITE_OTHER): Payer: BC Managed Care – PPO | Admitting: Obstetrics and Gynecology

## 2016-12-23 VITALS — BP 102/60 | HR 92 | Resp 16 | Wt 138.0 lb

## 2016-12-23 DIAGNOSIS — Z8543 Personal history of malignant neoplasm of ovary: Secondary | ICD-10-CM

## 2016-12-23 DIAGNOSIS — R971 Elevated cancer antigen 125 [CA 125]: Secondary | ICD-10-CM

## 2016-12-23 NOTE — Progress Notes (Signed)
GYNECOLOGY  VISIT   HPI: 36 y.o.   Single  Caucasian  female   [name] with Patient's last menstrual period was 12/18/2016.   here for ultra sound follow up. The patient has a h/o ovarian cancer, has had an LSO and right ovarian cystectomy for cystadenoma (also with endometriosis). She has had elevated CA 125's, recent CA 125 of 129.2 (was higher last year prior to her cystectomy).   GYNECOLOGIC HISTORY: Patient's last menstrual period was 12/18/2016. Contraception:OCP Menopausal hormone therapy: none         OB History    Gravida Para Term Preterm AB Living   0 0 0 0 0 0   SAB TAB Ectopic Multiple Live Births   0 0 0 0           Patient Active Problem List   Diagnosis Date Noted  . Cyst of right ovary   . Endometriosis of ovary   . Migraines 02/20/2015  . Ovarian cancer ([place]) 11/06/2011    Past Medical History:  Diagnosis Date  . Anxiety   . Depression   . Dizziness   . GERD (gastroesophageal reflux disease)   . Migraine    with aura around menses  . Ovarian cancer ([place]) 2008   Stage 1A    Past Surgical History:  Procedure Laterality Date  . APPENDECTOMY  08/2006  . BUNIONECTOMY  02/2007  . CYSTECTOMY  07/2008   from right ovary  . ROBOTIC ASSISTED LAPAROSCOPIC OVARIAN CYSTECTOMY Right 06/14/2015   Procedure: ROBOTIC LAPAROSCOPIC RIGHT OVARIAN CYSTECTOMY/;  Surgeon: Everitt Amber, MD;  [facility]: [facility];  Service: Gynecology;  Laterality: Right;  . SALPINGOOPHORECTOMY  07/2006   Left for Stage 1A ovarian cancer  . WISDOM TOOTH EXTRACTION  2015    Current Outpatient Prescriptions  Medication Sig Dispense Refill  . buPROPion (WELLBUTRIN XL) 300 MG 24 hr tablet Take 300 mg by mouth every morning.    . docusate sodium (COLACE) 100 MG capsule Take 100 mg by mouth daily as needed for mild constipation.    . DULoxetine (CYMBALTA) 60 MG capsule Take 60 mg by mouth every morning.  2  . Loratadine (CLARITIN) 10 MG CAPS Take by mouth.    . norethindrone (MICRONOR,CAMILA,ERRIN)  0.35 MG tablet Take 1 tablet (0.35 mg total) by mouth daily. 3 Package 3   No current facility-administered medications for this visit.      ALLERGIES: Amoxicillin; Aspirin; and Nsaids  Family History  Problem Relation Age of Onset  . Heart attack Maternal Grandfather   . Heart disease Maternal Grandfather        Heart attack; CAD   . Fibromyalgia Mother   . Thyroid disease Mother   . Blindness Father   . Alcohol abuse Father   . Diabetes Maternal Aunt     Social History   Social History  . Marital status: Single    Spouse name: N/A  . Number of children: N/A  . Years of education: N/A   Occupational History  . Not on file.   Social History Main Topics  . Smoking status: Never Smoker  . Smokeless tobacco: Never Used  . Alcohol use 0.6 - 1.2 oz/week    1 - 2 Standard drinks or equivalent per week     Comment: occas  . Drug use: No  . Sexual activity: Not Currently   Other Topics Concern  . Not on file   Social History Narrative   [place]  0 children   Exercise-treadmill, weights 1-2 times per week       Review of Systems  Constitutional: Negative.   HENT: Negative.   Eyes: Negative.   Respiratory: Negative.   Cardiovascular: Negative.   Gastrointestinal: Negative.   Genitourinary: Negative.   Musculoskeletal: Negative.   Skin: Negative.   Neurological: Negative.   Endo/Heme/Allergies: Negative.   Psychiatric/Behavioral: Negative.     PHYSICAL EXAMINATION:    BP 102/60 (BP Location: Right Arm, Patient Position: Sitting, Cuff Size: Normal)   Pulse 92   Resp 16   Wt 138 lb (62.6 kg)   LMP 12/18/2016   BMI 27.41 kg/m     General appearance: alert, cooperative and appears stated age   ASSESSMENT H/o ovarian cancer, s/p LSO H/O right cystectomy last year for cystadenoma, noted to have endometriosis at the time of surgery Elevated CA 125    PLAN Normal ultrasound today, images reviewed with the  patient She will schedule f/u with Dr Denman George (overdue) Routine f/u here   An After Visit Summary was printed and given to the patient.  CC: Dr Denman George

## 2017-03-23 ENCOUNTER — Ambulatory Visit: Payer: BC Managed Care – PPO | Admitting: Obstetrics and Gynecology

## 2017-03-30 ENCOUNTER — Encounter: Payer: Self-pay | Admitting: Obstetrics and Gynecology

## 2017-03-30 ENCOUNTER — Other Ambulatory Visit: Payer: Self-pay

## 2017-03-30 ENCOUNTER — Ambulatory Visit (INDEPENDENT_AMBULATORY_CARE_PROVIDER_SITE_OTHER): Payer: BC Managed Care – PPO | Admitting: Obstetrics and Gynecology

## 2017-03-30 ENCOUNTER — Other Ambulatory Visit (HOSPITAL_COMMUNITY)
Admission: RE | Admit: 2017-03-30 | Discharge: 2017-03-30 | Disposition: A | Discharge status: Home / Self Care | Payer: BC Managed Care – PPO | Source: Ambulatory Visit | Attending: Obstetrics and Gynecology | Admitting: Obstetrics and Gynecology

## 2017-03-30 VITALS — BP 118/80 | HR 88 | Resp 14 | Ht 60.0 in | Wt 137.0 lb

## 2017-03-30 DIAGNOSIS — Z8742 Personal history of other diseases of the female genital tract: Secondary | ICD-10-CM

## 2017-03-30 DIAGNOSIS — Z0000 Encounter for general adult medical examination without abnormal findings: Secondary | ICD-10-CM | POA: Diagnosis not present

## 2017-03-30 DIAGNOSIS — Z124 Encounter for screening for malignant neoplasm of cervix: Secondary | ICD-10-CM

## 2017-03-30 DIAGNOSIS — Z8543 Personal history of malignant neoplasm of ovary: Secondary | ICD-10-CM

## 2017-03-30 DIAGNOSIS — E559 Vitamin D deficiency, unspecified: Secondary | ICD-10-CM | POA: Diagnosis not present

## 2017-03-30 DIAGNOSIS — Z01419 Encounter for gynecological examination (general) (routine) without abnormal findings: Secondary | ICD-10-CM | POA: Diagnosis not present

## 2017-03-30 NOTE — Patient Instructions (Signed)
EXERCISE AND DIET:  We recommended that you start or continue a regular exercise program for good health. Regular exercise means any activity that makes your heart beat faster and makes you sweat.  We recommend exercising at least 30 minutes per day at least 3 days a week, preferably 4 or 5.  We also recommend a diet low in fat and sugar.  Inactivity, poor dietary choices and obesity can cause diabetes, heart attack, stroke, and kidney damage, among others.    ALCOHOL AND SMOKING:  Women should limit their alcohol intake to no more than 7 drinks/beers/glasses of wine (combined, not each!) per week. Moderation of alcohol intake to this level decreases your risk of breast cancer and liver damage. And of course, no recreational drugs are part of a healthy lifestyle.  And absolutely no smoking or even second hand smoke. Most people know smoking can cause heart and lung diseases, but did you know it also contributes to weakening of your bones? Aging of your skin?  Yellowing of your teeth and nails?  CALCIUM AND VITAMIN D:  Adequate intake of calcium and Vitamin D are recommended.  The recommendations for exact amounts of these supplements seem to change often, but generally speaking 600 mg of calcium (either carbonate or citrate) and 800 units of Vitamin D per day seems prudent. Certain women may benefit from higher intake of Vitamin D.  If you are among these women, your doctor will have told you during your visit.    PAP SMEARS:  Pap smears, to check for cervical cancer or precancers,  have traditionally been done yearly, although recent scientific advances have shown that most women can have pap smears less often.  However, every woman still should have a physical exam from her gynecologist every year. It will include a breast check, inspection of the vulva and vagina to check for abnormal growths or skin changes, a visual exam of the cervix, and then an exam to evaluate the size and shape of the uterus and  ovaries.  And after 35 years of age, a rectal exam is indicated to check for rectal cancers. We will also provide age appropriate advice regarding health maintenance, like when you should have certain vaccines, screening for sexually transmitted diseases, bone density testing, colonoscopy, mammograms, etc.   MAMMOGRAMS:  All women over 40 years old should have a yearly mammogram. Many facilities now offer a "3D" mammogram, which may cost around $50 extra out of pocket. If possible,  we recommend you accept the option to have the 3D mammogram performed.  It both reduces the number of women who will be called back for extra views which then turn out to be normal, and it is better than the routine mammogram at detecting truly abnormal areas.    COLONOSCOPY:  Colonoscopy to screen for colon cancer is recommended for all women at age 50.  We know, you hate the idea of the prep.  We agree, BUT, having colon cancer and not knowing it is worse!!  Colon cancer so often starts as a polyp that can be seen and removed at colonscopy, which can quite literally save your life!  And if your first colonoscopy is normal and you have no family history of colon cancer, most women don't have to have it again for 10 years.  Once every ten years, you can do something that may end up saving your life, right?  We will be happy to help you get it scheduled when you are ready.    Be sure to check your insurance coverage so you understand how much it will cost.  It may be covered as a preventative service at no cost, but you should check your particular policy.      Breast Self-Awareness Breast self-awareness means being familiar with how your breasts look and feel. It involves checking your breasts regularly and reporting any changes to your health care provider. Practicing breast self-awareness is important. A change in your breasts can be a sign of a serious medical problem. Being familiar with how your breasts look and feel allows  you to find any problems early, when treatment is more likely to be successful. All women should practice breast self-awareness, including women who have had breast implants. How to do a breast self-exam One way to learn what is normal for your breasts and whether your breasts are changing is to do a breast self-exam. To do a breast self-exam: Look for Changes  1. Remove all the clothing above your waist. 2. Stand in front of a mirror in a room with good lighting. 3. Put your hands on your hips. 4. Push your hands firmly downward. 5. Compare your breasts in the mirror. Look for differences between them (asymmetry), such as: ? Differences in shape. ? Differences in size. ? Puckers, dips, and bumps in one breast and not the other. 6. Look at each breast for changes in your skin, such as: ? Redness. ? Scaly areas. 7. Look for changes in your nipples, such as: ? Discharge. ? Bleeding. ? Dimpling. ? Redness. ? A change in position. Feel for Changes  Carefully feel your breasts for lumps and changes. It is best to do this while lying on your back on the floor and again while sitting or standing in the shower or tub with soapy water on your skin. Feel each breast in the following way:  Place the arm on the side of the breast you are examining above your head.  Feel your breast with the other hand.  Start in the nipple area and make  inch (2 cm) overlapping circles to feel your breast. Use the pads of your three middle fingers to do this. Apply light pressure, then medium pressure, then firm pressure. The light pressure will allow you to feel the tissue closest to the skin. The medium pressure will allow you to feel the tissue that is a little deeper. The firm pressure will allow you to feel the tissue close to the ribs.  Continue the overlapping circles, moving downward over the breast until you feel your ribs below your breast.  Move one finger-width toward the center of the body.  Continue to use the  inch (2 cm) overlapping circles to feel your breast as you move slowly up toward your collarbone.  Continue the up and down exam using all three pressures until you reach your armpit.  Write Down What You Find  Write down what is normal for each breast and any changes that you find. Keep a written record with breast changes or normal findings for each breast. By writing this information down, you do not need to depend only on memory for size, tenderness, or location. Write down where you are in your menstrual cycle, if you are still menstruating. If you are having trouble noticing differences in your breasts, do not get discouraged. With time you will become more familiar with the variations in your breasts and more comfortable with the exam. How often should I examine my breasts? Examine   your breasts every month. If you are breastfeeding, the best time to examine your breasts is after a feeding or after using a breast pump. If you menstruate, the best time to examine your breasts is 5-7 days after your period is over. During your period, your breasts are lumpier, and it may be more difficult to notice changes. When should I see my health care provider? See your health care provider if you notice:  A change in shape or size of your breasts or nipples.  A change in the skin of your breast or nipples, such as a reddened or scaly area.  Unusual discharge from your nipples.  A lump or thick area that was not there before.  Pain in your breasts.  Anything that concerns you.  This information is not intended to replace advice given to you by your health care provider. Make sure you discuss any questions you have with your health care provider. Document Released: 02/24/2005 Document Revised: 08/02/2015 Document Reviewed: 01/14/2015 Elsevier Interactive Patient Education  2018 [employer]vier Inc[employer]  

## 2017-03-30 NOTE — Progress Notes (Signed)
37 y.o. [name] SingleCaucasianF here for annual exam.  The patient has a h/o LSO for stage 1A ovarian cancer. In 2017 she had a right ovarian cystectomy for a cystadenoma, also noted to have endometriosis.  Last CA 125 was in 10/18 and was 129 (down from 229). Ultrasound from 10/18 was normal.  Period Cycle (Days): 28 Period Duration (Days): 3-4 days  Period Pattern: Regular Menstrual Flow: Heavy, Light Menstrual Control: Tampon Menstrual Control Change Freq (Hours): changes tampon every 2-3 hours  Dysmenorrhea: None  Patient's last menstrual period was 03/15/2017.          Sexually active: No.  The current method of family planning is oral birth control pill.  Progesterone only.  Exercising: No.  The patient does not participate in regular exercise at present. Smoker:  no  Health Maintenance: Pap:  10-21-12 WNL NEG HR HPV  History of abnormal Pap:  no TDaP:  2012 Gardasil: no    reports that  has never smoked. she has never used smokeless tobacco. She reports that she drinks about 0.6 - 1.8 oz of alcohol per week. She reports that she does not use drugs. She is an Chiropodist.   Past Medical History:  Diagnosis Date  . Anxiety   . Depression   . Dizziness   . GERD (gastroesophageal reflux disease)   . Migraine    with aura around menses  . Ovarian cancer ([facility]aron [facility]) 2008   Stage 1A  She hasn't had an aura in years.   Past Surgical History:  Procedure Laterality Date  . APPENDECTOMY  08/2006  . BUNIONECTOMY  02/2007  . CYSTECTOMY  07/2008   from right ovary  . ROBOTIC ASSISTED LAPAROSCOPIC OVARIAN CYSTECTOMY Right 06/14/2015   Procedure: ROBOTIC LAPAROSCOPIC RIGHT OVARIAN CYSTECTOMY/;  Surgeon: Sharon Amber, MD;  Location: [facility];  Service: Gynecology;  Laterality: Right;  . SALPINGOOPHORECTOMY  07/2006   Left for Stage 1A ovarian cancer  . WISDOM TOOTH EXTRACTION  2015    Current Outpatient Medications  Medication Sig Dispense Refill  . buPROPion (WELLBUTRIN XL)  300 MG 24 hr tablet Take 300 mg by mouth every morning.    . docusate sodium (COLACE) 100 MG capsule Take 100 mg by mouth daily as needed for mild constipation.    . DULoxetine (CYMBALTA) 60 MG capsule Take 60 mg by mouth every morning.  2  . Loratadine (CLARITIN) 10 MG CAPS Take by mouth.    . norethindrone (MICRONOR,CAMILA,ERRIN) 0.35 MG tablet Take 1 tablet (0.35 mg total) by mouth daily. 3 Package 3   No current facility-administered medications for this visit.     Family History  Problem Relation Age of Onset  . Heart attack Maternal Grandfather   . Heart disease Maternal Grandfather        Heart attack; CAD   . Fibromyalgia Mother   . Thyroid disease Mother   . Blindness Father   . Alcohol abuse Father   . Diabetes Maternal Aunt     Review of Systems  Constitutional: Negative.   HENT: Negative.   Eyes: Negative.   Respiratory: Negative.   Cardiovascular: Negative.   Gastrointestinal: Negative.   Endocrine: Negative.   Genitourinary: Negative.   Musculoskeletal: Negative.   Skin: Negative.   Allergic/Immunologic: Negative.   Neurological: Negative.   Psychiatric/Behavioral: Negative.        Depression   She has some constipation, BM every other day. Trying to change her diet Depression is mostly controlled, on medication, seeing a  therapist. Psychiatric NP manages her medication.   Exam:   BP 118/80 (BP Location: Right Arm, Patient Position: Sitting, Cuff Size: Normal)   Pulse 88   Resp 14   Ht 5' (1.524 m)   Wt 137 lb (62.1 kg)   LMP 03/15/2017   BMI 26.76 kg/m   Weight change: @WEIGHTCHANGE @ Height:   Height: 5' (152.4 cm)  Ht Readings from Last 3 Encounters:  03/30/17 5' (1.524 m)  12/18/16 4' 11.5" (1.511 m)  11/08/15 4' 11.5" (1.511 m)    General appearance: alert, cooperative and appears stated age Head: Normocephalic, without obvious abnormality, atraumatic Neck: no adenopathy, supple, symmetrical, trachea midline and thyroid normal to inspection  and palpation Lungs: clear to auscultation bilaterally Cardiovascular: regular rate and rhythm Breasts: normal appearance, no masses or tenderness Abdomen: soft, non-tender; non distended,  no masses,  no organomegaly Extremities: extremities normal, atraumatic, no cyanosis or edema Skin: Skin color, texture, turgor normal. No rashes or lesions Lymph nodes: Cervical, supraclavicular, and axillary nodes normal. No abnormal inguinal nodes palpated Neurologic: Grossly normal   Pelvic: External genitalia:  no lesions              Urethra:  normal appearing urethra with no masses, tenderness or lesions              Bartholins and Skenes: normal                 Vagina: normal appearing vagina with normal color and discharge, no lesions              Cervix: no lesions               Bimanual Exam:  Uterus:  normal size, contour, position, consistency, mobility, non-tender              Adnexa: no mass, fullness, tenderness               Rectovaginal: Confirms               Anus:  normal sphincter tone, no lesions  Chaperone was present for exam.  A:  Well Woman with normal exam  H/O ovarian cancer (last u/s in 10/18 was normal)  H/O endometriosis  H/O of an elevated CA 125  Vit d def  P:   Pap with hpv  Discussed the gardasil, information given  CA 125  She needs to f/u with Sharon Holmes (overdue)  Screening labs  Vit D def

## 2017-03-31 ENCOUNTER — Other Ambulatory Visit: Payer: Self-pay | Admitting: Obstetrics and Gynecology

## 2017-03-31 DIAGNOSIS — E559 Vitamin D deficiency, unspecified: Secondary | ICD-10-CM

## 2017-03-31 LAB — VITAMIN D 25 HYDROXY (VIT D DEFICIENCY, FRACTURES): VIT D 25 HYDROXY: 17.1 ng/mL — AB (ref 30.0–100.0)

## 2017-03-31 LAB — COMPREHENSIVE METABOLIC PANEL
ALBUMIN: 4.5 g/dL (ref 3.5–5.5)
ALT: 16 IU/L (ref 0–32)
AST: 17 IU/L (ref 0–40)
Albumin/Globulin Ratio: 2 (ref 1.2–2.2)
Alkaline Phosphatase: 70 IU/L (ref 39–117)
BUN / CREAT RATIO: 18 (ref 9–23)
BUN: 11 mg/dL (ref 6–20)
Bilirubin Total: 0.2 mg/dL (ref 0.0–1.2)
CO2: 19 mmol/L — ABNORMAL LOW (ref 20–29)
CREATININE: 0.62 mg/dL (ref 0.57–1.00)
Calcium: 9.5 mg/dL (ref 8.7–10.2)
Chloride: 102 mmol/L (ref 96–106)
GFR calc non Af Amer: 118 mL/min/{1.73_m2} (ref >59)
GFR, EST AFRICAN AMERICAN: 136 mL/min/{1.73_m2} (ref >59)
GLUCOSE: 90 mg/dL (ref 65–99)
Globulin, Total: 2.2 g/dL (ref 1.5–4.5)
Potassium: 4.4 mmol/L (ref 3.5–5.2)
Sodium: 139 mmol/L (ref 134–144)
TOTAL PROTEIN: 6.7 g/dL (ref 6.0–8.5)

## 2017-03-31 LAB — CBC
HEMATOCRIT: 39.7 % (ref 34.0–46.6)
HEMOGLOBIN: 13.3 g/dL (ref 11.1–15.9)
MCH: 30.4 pg (ref 26.6–33.0)
MCHC: 33.5 g/dL (ref 31.5–35.7)
MCV: 91 fL (ref 79–97)
Platelets: 325 10*3/uL (ref 150–379)
RBC: 4.37 x10E6/uL (ref 3.77–5.28)
RDW: 14.1 % (ref 12.3–15.4)
WBC: 9.5 10*3/uL (ref 3.4–10.8)

## 2017-03-31 LAB — LIPID PANEL
Chol/HDL Ratio: 3.8 ratio (ref 0.0–4.4)
Cholesterol, Total: 169 mg/dL (ref 100–199)
HDL: 44 mg/dL (ref >39)
LDL CALC: 96 mg/dL (ref 0–99)
Triglycerides: 147 mg/dL (ref 0–149)
VLDL CHOLESTEROL CAL: 29 mg/dL (ref 5–40)

## 2017-03-31 LAB — CA 125: CANCER ANTIGEN (CA) 125: 35.8 U/mL (ref 0.0–38.1)

## 2017-04-01 LAB — CYTOLOGY - PAP
DIAGNOSIS: NEGATIVE
HPV (WINDOPATH): NOT DETECTED

## 2017-08-16 IMAGING — CT CT ABD-PELV W/ CM
2 of 4 series · 17 of 46 positions shown, 19 images · IV contrast (OMNIPAQUE)
Comparison: 12/23/2013

CLINICAL DATA: Followup stage I ovarian carcinoma. Previous left
oophorectomy in 3883. Rising serum CA 125 levels.

EXAM:
CT ABDOMEN AND PELVIS WITH CONTRAST
TECHNIQUE: Multidetector CT imaging of the abdomen and pelvis was performed
using the standard protocol following bolus administration of
intravenous contrast.
CONTRAST:  100mL PUPEAP-ROO IOPAMIDOL (PUPEAP-ROO) INJECTION 61%

[Series 2: rtn a/p with · axial · 0.68mm/px · z∈[-448,-64]mm · 14 of 85 slices shown, 16 images]
[im 4/85  soft-tissue]
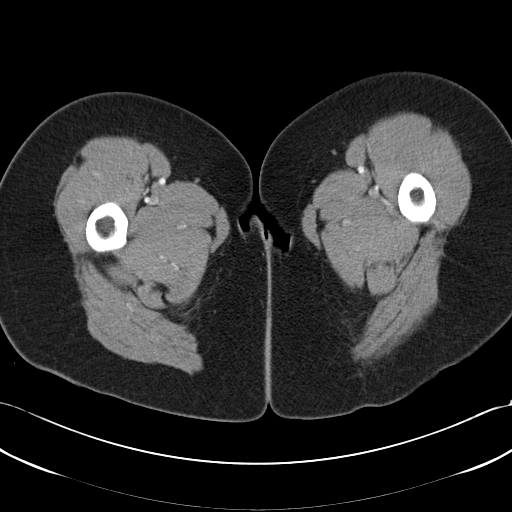
[im 4/85  bone]
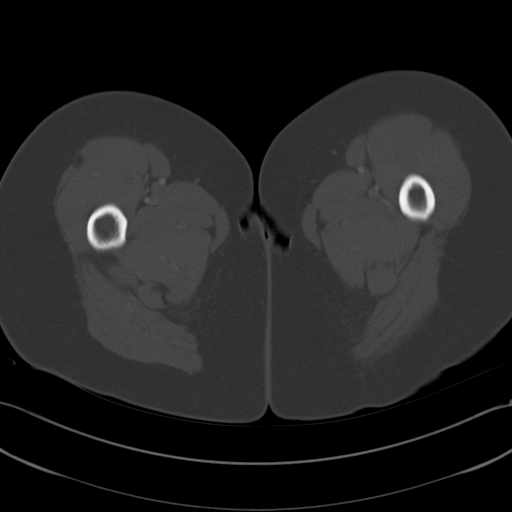
[im 11/85  soft-tissue]
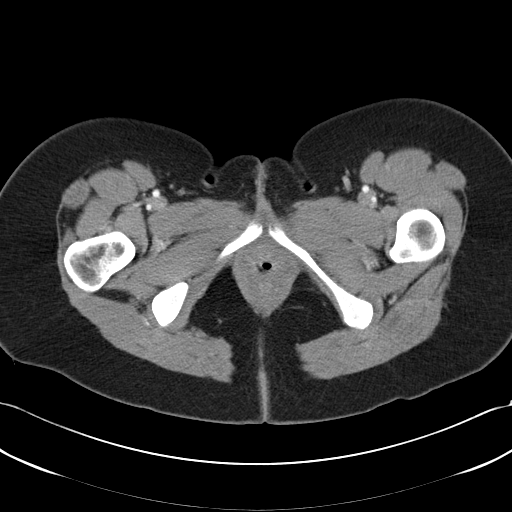
[im 18/85  soft-tissue]
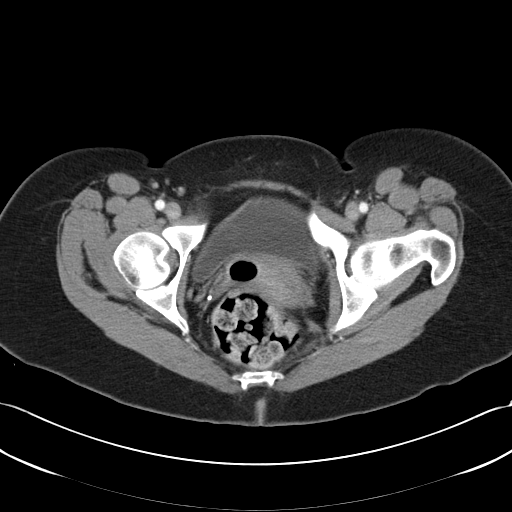
[im 22/85  soft-tissue]
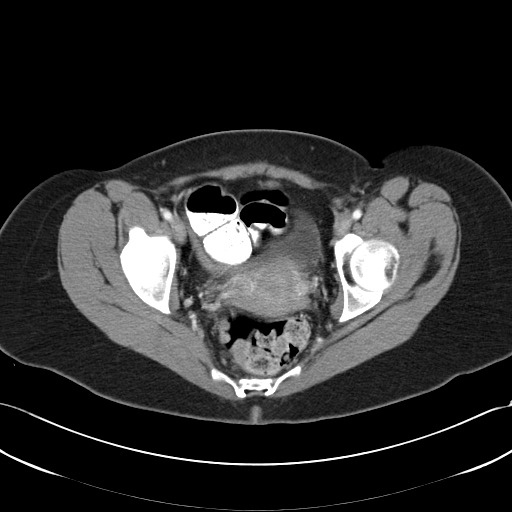
[im 29/85  soft-tissue]
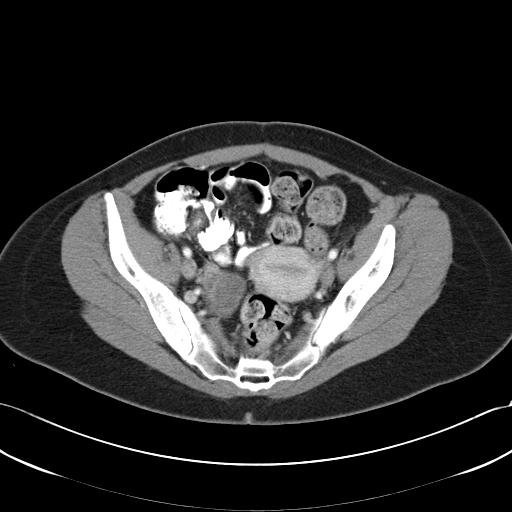
[im 36/85  soft-tissue]
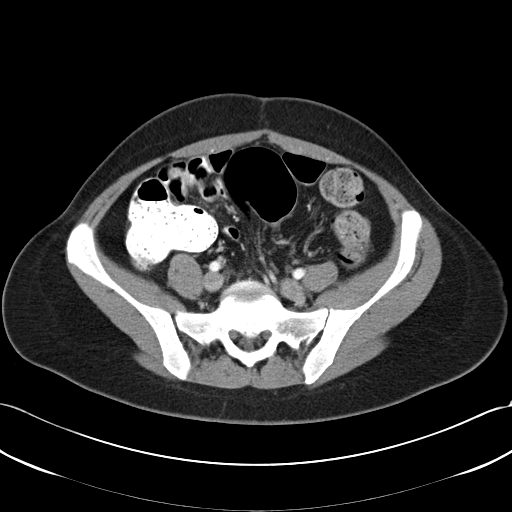
[im 39/85  soft-tissue]
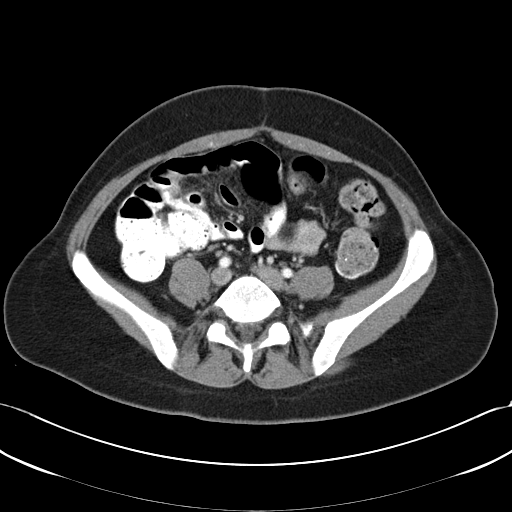
[im 46/85  soft-tissue]
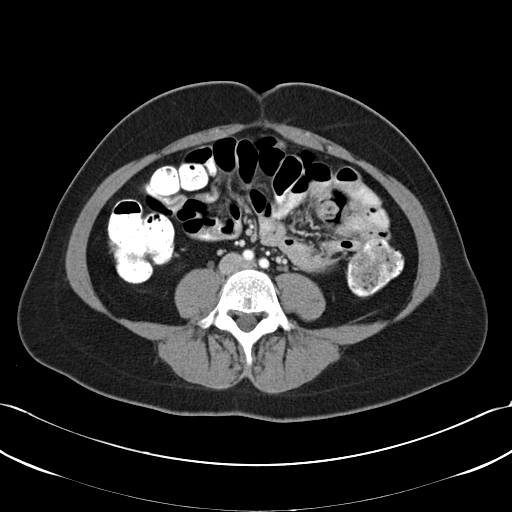
[im 50/85  soft-tissue]
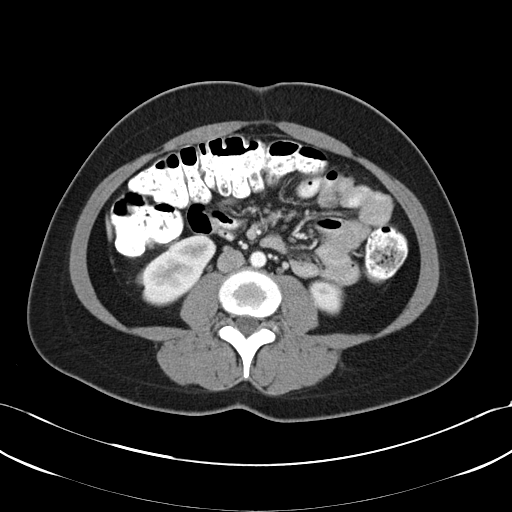
[im 50/85  bone]
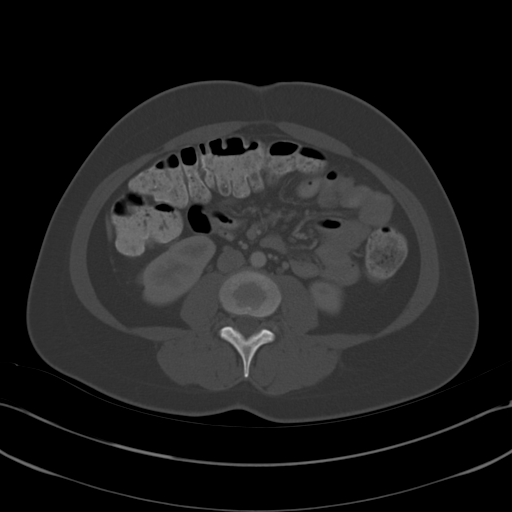
[im 57/85  soft-tissue]
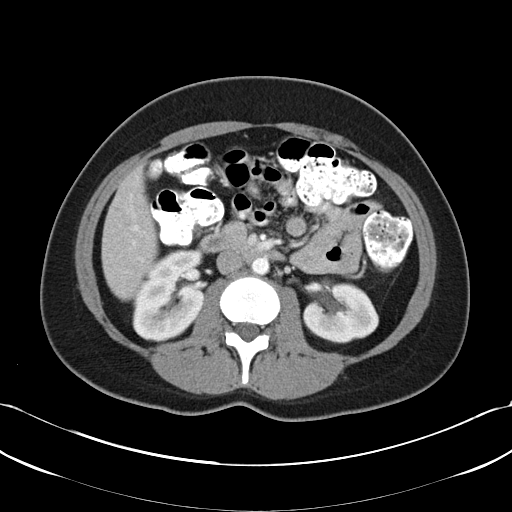
[im 64/85  soft-tissue]
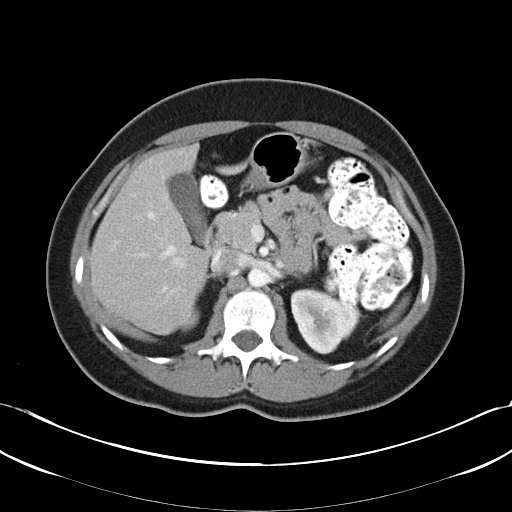
[im 67/85  soft-tissue]
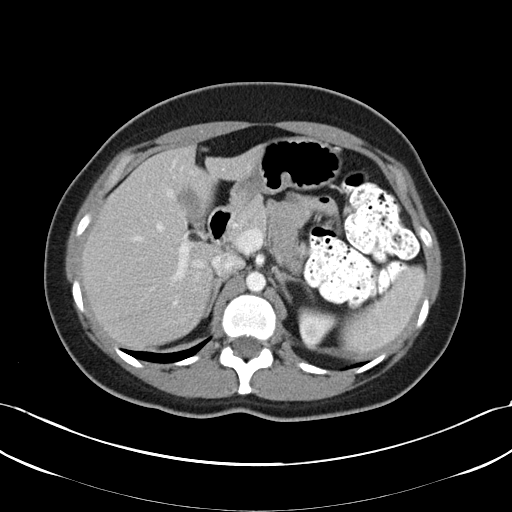
[im 74/85  soft-tissue]
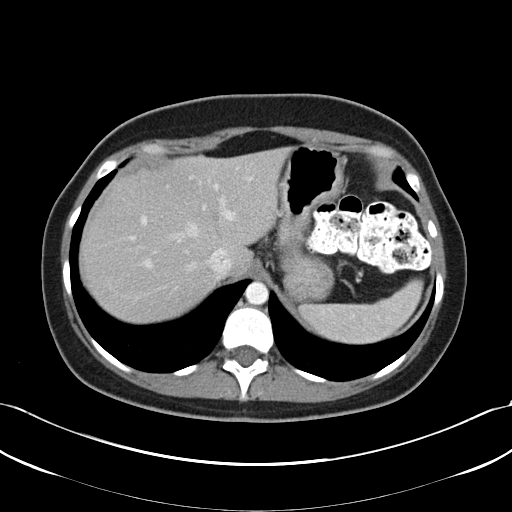
[im 81/85  soft-tissue]
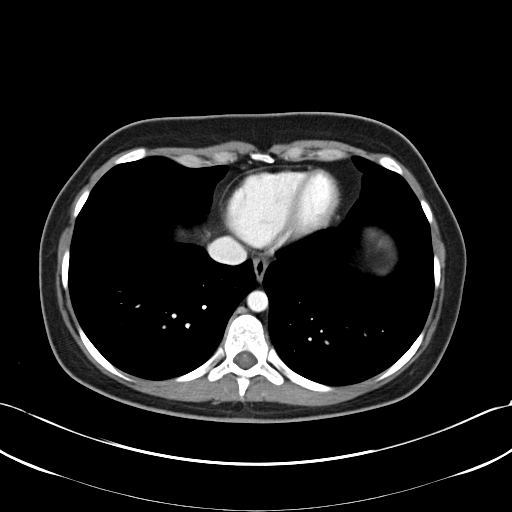

[Series 602: <mpr thick range> · coronal · 0.82mm/px · 3 of 82 slices shown]
[im 28/82  soft-tissue]
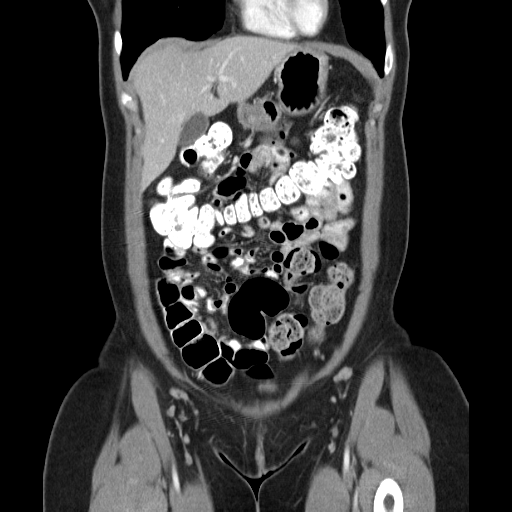
[im 37/82  soft-tissue]
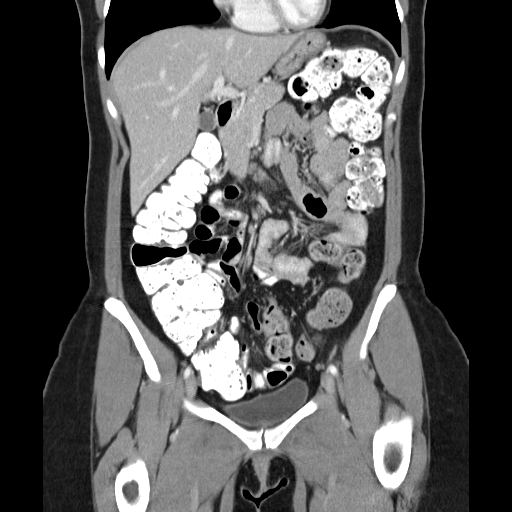
[im 46/82  soft-tissue]
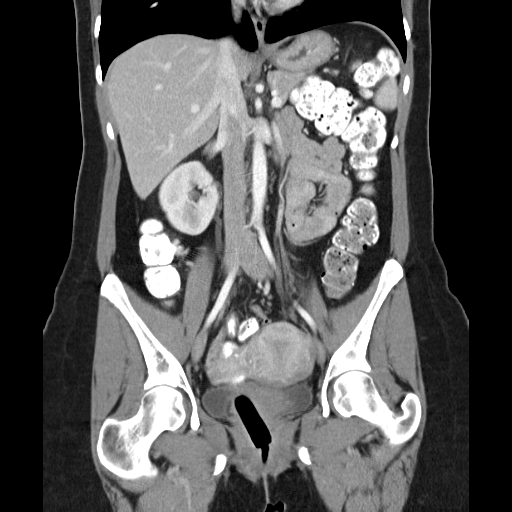

[17 of 46 positions shown; findings below may reference images not displayed]

FINDINGS: Lower chest:  No acute findings.

Hepatobiliary: No masses or other significant abnormality.
Gallbladder is unremarkable.

Pancreas: Appears normal

Spleen: Within normal limits in size and appearance.

Adrenals/Urinary Tract: No masses identified. No evidence of
hydronephrosis.

Stomach/Bowel: No evidence of obstruction, inflammatory process, or
abnormal fluid collections.

Vascular/Lymphatic: No pathologically enlarged lymph nodes. No
evidence of abdominal aortic aneurysm.

Reproductive: Uterus is normal in appearance. Left ovary is
surgically absent. A cyst is seen in the right ovary which has
benign features. This measures 3.3 x 2.4 cm and has increased in
size on image 60 of series 2 and is increased in size from 1.8 x
cm on 1828 exam. No definite malignant features are visualized by
CT.

Other: No evidence of ascites or peritoneal nodules.

Musculoskeletal:  No suspicious bone lesions identified.
IMPRESSION: 3.3 cm right ovarian cyst which is increased in size since previous
CT in 1828, but has benign appearing characteristics by CT. In this
reproductive age female, consider followup with pelvic ultrasound or
pelvic MRI without and with contrast in 6-12 weeks.

No evidence of metastatic disease within the abdomen or pelvis.

## 2017-11-25 ENCOUNTER — Other Ambulatory Visit: Payer: Self-pay | Admitting: Obstetrics and Gynecology

## 2017-11-25 NOTE — Telephone Encounter (Signed)
Medication refill request: OCP Last AEX:  03/30/2017 Next AEX: not scheduled Last MMG (if hormonal medication request): n/a Refill authorized: #[id number], 0 refills

## 2017-12-27 DIAGNOSIS — F411 Generalized anxiety disorder: Secondary | ICD-10-CM | POA: Insufficient documentation

## 2017-12-27 DIAGNOSIS — F331 Major depressive disorder, recurrent, moderate: Secondary | ICD-10-CM

## 2018-01-07 ENCOUNTER — Ambulatory Visit (INDEPENDENT_AMBULATORY_CARE_PROVIDER_SITE_OTHER): Payer: BC Managed Care – PPO | Admitting: Physician Assistant

## 2018-01-07 ENCOUNTER — Encounter: Payer: Self-pay | Admitting: Physician Assistant

## 2018-01-07 DIAGNOSIS — F411 Generalized anxiety disorder: Secondary | ICD-10-CM

## 2018-01-07 DIAGNOSIS — F331 Major depressive disorder, recurrent, moderate: Secondary | ICD-10-CM | POA: Diagnosis not present

## 2018-01-07 MED ORDER — VORTIOXETINE HBR 10 MG PO TABS: 10.0000 mg | ORAL_TABLET | Freq: Every day | ORAL | 0 refills | Status: DC

## 2018-01-07 NOTE — Progress Notes (Signed)
[facility]  Patient ID: Sharon Holmes,  MRN: [id number]  PCP: Haywood Pao, MD  Date of Evaluation: 01/07/2018 Time spent:15 minutes  Chief Complaint:   HISTORY/CURRENT STATUS: HPI Here for 6 week med check.  States she is doing very well and likes the Trintellix.  She does not feel like a zombie with no emotions, on the Trintellix versus other medication she is tried.  It is really help with the anxiety and depression.  She denies anhedonia, decreased energy or motivation.  No isolating.  Work is going well.  She sleeps fine.  Anxiety is controlled for the most part.  Sometimes, she gets triggered like when she had an evaluation by her principal, but otherwise she is fine.  And of course flying makes her nervous.  The Xanax helps that.  She rarely has to take it though.  Past medications for mental health diagnoses include: Prozac, Cymbalta, Wellbutrin, Xanax, Effexor, Lexapro, Trintellix  Individual Medical History/ Review of Systems: Changes? :No   Allergies: Amoxicillin; Aspirin; and Nsaids  Current Medications:  Current Outpatient Medications:  .  acetaminophen (TYLENOL) 325 MG tablet, Take 650 mg by mouth every 6 (six) hours as needed., Disp: , Rfl:  .  ALPRAZolam (XANAX) 0.5 MG tablet, Take 0.5 mg by mouth 2 (two) times daily as needed for anxiety (take 0.5-1 tabs as needed)., Disp: , Rfl:  .  buPROPion (WELLBUTRIN XL) 300 MG 24 hr tablet, Take 300 mg by mouth every morning., Disp: , Rfl:  .  ERRIN 0.35 MG tablet, TAKE 1 TABLET BY MOUTH EVERY DAY, Disp: 84 tablet, Rfl: 0 .  Loratadine (CLARITIN) 10 MG CAPS, Take by mouth., Disp: , Rfl:  .  Multiple Vitamins-Minerals (MULTIVITAMIN WITH MINERALS) tablet, Take 1 tablet by mouth daily., Disp: , Rfl:  .  vortioxetine HBr (TRINTELLIX) 10 MG TABS tablet, Take 1 tablet (10 mg total) by mouth daily. TAKE 1 TAB DAILY, Disp: 90 tablet, Rfl: 0 .  docusate sodium (COLACE) 100 MG capsule, Take 100 mg by mouth daily as  needed for mild constipation., Disp: , Rfl:  Medication Side Effects: none  Family Medical/ Social History: Changes? No  MENTAL HEALTH EXAM:  There were no vitals taken for this visit.There is no height or weight on file to calculate BMI.  General Appearance: Casual  Eye Contact:  Good  Speech:  Clear and Coherent  Volume:  Normal  Mood:  Euthymic  Affect:  Appropriate  Thought Process:  Goal Directed  Orientation:  Full (Time, Place, and Person)  Thought Content: Logical   Suicidal Thoughts:  No  Homicidal Thoughts:  No  Memory:  WNL  Judgement:  Good  Insight:  Good  Psychomotor Activity:  Normal  Concentration:  Concentration: Good  Recall:  Good  Fund of Knowledge: Good  Language: Good  Assets:  Desire for Improvement  ADL's:  Intact  Cognition: WNL  Prognosis:  Good    DIAGNOSES:    ICD-10-CM   1. Major depressive disorder, recurrent episode, moderate (HCC) F33.1   2. Generalized anxiety disorder F41.1     Receiving Psychotherapy: Yes Matt Sixberry  RECOMMENDATIONS: Glad she is responding so well. Continue meds as above.  We did decide that if needed, we can increase the Trintellix over the phone.  We both agreed to leave it the same for now because she is responded so well at this dose. Continue psychotherapy Return in 3 months or sooner as needed. Donnal Moat, PA-C

## 2018-02-18 ENCOUNTER — Other Ambulatory Visit: Payer: Self-pay | Admitting: Obstetrics and Gynecology

## 2018-02-18 NOTE — Telephone Encounter (Signed)
Medication refill request: Sharon Holmes  Last AEX:  03/30/17 Next AEX: noting scheduled at this time. I left a message to remind her to schedule her AEX.  Last MMG (if hormonal medication request): NA Refill authorized: #[id number] with no RF.

## 2018-04-09 ENCOUNTER — Ambulatory Visit: Payer: BC Managed Care – PPO | Admitting: Physician Assistant

## 2018-04-16 ENCOUNTER — Other Ambulatory Visit: Payer: Self-pay | Admitting: Physician Assistant

## 2018-05-11 ENCOUNTER — Encounter: Payer: Self-pay | Admitting: Physician Assistant

## 2018-05-11 ENCOUNTER — Ambulatory Visit: Payer: BC Managed Care – PPO | Admitting: Physician Assistant

## 2018-05-11 DIAGNOSIS — F331 Major depressive disorder, recurrent, moderate: Secondary | ICD-10-CM | POA: Diagnosis not present

## 2018-05-11 DIAGNOSIS — F411 Generalized anxiety disorder: Secondary | ICD-10-CM

## 2018-05-11 MED ORDER — VORTIOXETINE HBR 10 MG PO TABS: 10.0000 mg | ORAL_TABLET | Freq: Every day | ORAL | 1 refills | Status: DC

## 2018-05-11 MED ORDER — BUPROPION HCL ER (XL) 300 MG PO TB24: 300.0000 mg | ORAL_TABLET | Freq: Every morning | ORAL | 1 refills | Status: DC

## 2018-05-11 NOTE — Progress Notes (Signed)
[facility]  Patient ID: Sharon Holmes,  MRN: [id number]  PCP: Sharon Pao, MD  Date of Evaluation: 05/11/2018 Time spent:15 minutes  Chief Complaint:  Chief Complaint    Follow-up      HISTORY/CURRENT STATUS: HPI Here for routine med check.  Patient denies loss of interest in usual activities and is able to enjoy things.  Denies decreased energy or motivation.  Appetite has not changed.  No extreme sadness, tearfulness, or feelings of hopelessness.  Denies any changes in concentration, making decisions or remembering things.  Denies suicidal or homicidal thoughts.  Anxiety is not common.  No panic attacks.[name] needs the Xanax. But it's rare.  Does need when she flies.   Denies muscle or joint pain, stiffness, or dystonia.  Denies dizziness, syncope, seizures, numbness, tingling, tremor, tics, unsteady gait, slurred speech, confusion.   Individual Medical History/ Review of Systems: Changes? :No    Past medications for mental health diagnoses include: Prozac, Cymbalta, Effexor, Lexapro, Xanax  Allergies: Amoxicillin; Aspirin; and Nsaids  Current Medications:  Current Outpatient Medications:  .  acetaminophen (TYLENOL) 325 MG tablet, Take 650 mg by mouth every 6 (six) hours as needed., Disp: , Rfl:  .  ALPRAZolam (XANAX) 0.5 MG tablet, Take 0.5 mg by mouth 2 (two) times daily as needed for anxiety (take 0.5-1 tabs as needed)., Disp: , Rfl:  .  buPROPion (WELLBUTRIN XL) 300 MG 24 hr tablet, TAKE 1 TABLET EVERY MORNING, Disp: 90 tablet, Rfl: 0 .  ERRIN 0.35 MG tablet, TAKE 1 TABLET BY MOUTH EVERY DAY, Disp: 84 tablet, Rfl: 0 .  Loratadine (CLARITIN) 10 MG CAPS, Take by mouth., Disp: , Rfl:  .  Multiple Vitamins-Minerals (MULTIVITAMIN WITH MINERALS) tablet, Take 1 tablet by mouth daily., Disp: , Rfl:  .  vortioxetine HBr (TRINTELLIX) 10 MG TABS tablet, Take 1 tablet (10 mg total) by mouth daily. TAKE 1 TAB DAILY, Disp: 90 tablet, Rfl: 0 .  docusate sodium  (COLACE) 100 MG capsule, Take 100 mg by mouth daily as needed for mild constipation., Disp: , Rfl:  Medication Side Effects: none  Family Medical/ Social History: Changes? 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  She has a second interview on Thursday and is torn between staying where she is and possibly taking a new job.  MENTAL HEALTH EXAM:  There were no vitals taken for this visit.There is no height or weight on file to calculate BMI.  General Appearance: Casual and Well Groomed  Eye Contact:  Good  Speech:  Clear and Coherent  Volume:  Normal  Mood:  Euthymic  Affect:  Appropriate  Thought Process:  Goal Directed  Orientation:  Full (Time, Place, and Person)  Thought Content: Logical   Suicidal Thoughts:  No  Homicidal Thoughts:  No  Memory:  WNL  Judgement:  Good  Insight:  Good  Psychomotor Activity:  Normal  Concentration:  Concentration: Good  Recall:  Good  Fund of Knowledge: Good  Language: Good  Assets:  Desire for Improvement  ADL's:  Intact  Cognition: WNL  Prognosis:  Good    DIAGNOSES:    ICD-10-CM   1. Major depressive disorder, recurrent episode, moderate (HCC) F33.1   2. Generalized anxiety disorder F41.1     Receiving Psychotherapy: Yes Matt Sixberry   RECOMMENDATIONS: Continue Trintellix 10 mg qd. Continue Wellbutrin XL 300mg  qd. Cont Xanax 0.5mg   Bid prn. Return in 6 months   Donnal Moat, PA-C   This  record has been created using [employer].  Chart creation errors have been sought, but may not always have been located and corrected. Such creation errors do not reflect on the standard of medical care.

## 2018-09-23 ENCOUNTER — Other Ambulatory Visit: Payer: Self-pay

## 2018-09-23 ENCOUNTER — Telehealth: Payer: Self-pay | Admitting: *Deleted

## 2018-09-23 NOTE — Telephone Encounter (Signed)
Patient called and wanted to re-establish her care with Dr. Denman George. Explained that since it's been three years she would be considered a new patient. Patient verbalized understanding

## 2018-09-27 ENCOUNTER — Encounter: Payer: Self-pay | Admitting: Obstetrics and Gynecology

## 2018-09-27 ENCOUNTER — Other Ambulatory Visit: Payer: Self-pay

## 2018-09-27 ENCOUNTER — Ambulatory Visit: Payer: BC Managed Care – PPO | Admitting: Obstetrics and Gynecology

## 2018-09-27 ENCOUNTER — Telehealth: Payer: Self-pay | Admitting: Obstetrics and Gynecology

## 2018-09-27 VITALS — BP 126/84 | HR 84 | Temp 97.4°F | Ht 60.0 in | Wt 136.0 lb

## 2018-09-27 DIAGNOSIS — Z8742 Personal history of other diseases of the female genital tract: Secondary | ICD-10-CM | POA: Diagnosis not present

## 2018-09-27 DIAGNOSIS — Z0000 Encounter for general adult medical examination without abnormal findings: Secondary | ICD-10-CM

## 2018-09-27 DIAGNOSIS — Z8543 Personal history of malignant neoplasm of ovary: Secondary | ICD-10-CM | POA: Diagnosis not present

## 2018-09-27 DIAGNOSIS — Z23 Encounter for immunization: Secondary | ICD-10-CM | POA: Diagnosis not present

## 2018-09-27 DIAGNOSIS — E559 Vitamin D deficiency, unspecified: Secondary | ICD-10-CM

## 2018-09-27 DIAGNOSIS — Z01419 Encounter for gynecological examination (general) (routine) without abnormal findings: Secondary | ICD-10-CM | POA: Diagnosis not present

## 2018-09-27 DIAGNOSIS — R971 Elevated cancer antigen 125 [CA 125]: Secondary | ICD-10-CM

## 2018-09-27 DIAGNOSIS — R102 Pelvic and perineal pain: Secondary | ICD-10-CM

## 2018-09-27 NOTE — Progress Notes (Signed)
38 y.o. [name] Single White or Caucasian Not Hispanic or Latino female here for annual exam.   She has noticed discomfort in her lower abdomen in the last 2-3 weeks. Certain pants are uncomfortable, not bloating, no weight gain. She is burping more than normal. Slightly tender to touch. Slight constipation, no change, has a BM every other day.   She is overdue for a f/u appointment with Dr Denman George and has one scheduled for August 3.   Not currently sexually active.   She has a h/o a LSO for stage 1A ovarian cancer in 2008. In 2017 she had a right ovarian cystectomy for a cystadenoma, also noted to have endometriosis.   Period Cycle (Days): 28 Period Duration (Days): 4-5 days Period Pattern: Regular Menstrual Flow: Light, Moderate Menstrual Control: Tampon Menstrual Control Change Freq (Hours): changes tampon every 4 hours Dysmenorrhea: None  No LMP recorded (lmp unknown).          Sexually active: No.  The current method of family planning is abstinence.    Exercising: Yes.    walking Smoker:  no  Health Maintenance: Pap:  03/30/2017 WNL NEG HPV, 10-21-12 WNL NEG HR HPV  History of abnormal Pap:  no TDaP:  2012 Gardasil: no    reports that she has never smoked. She has never used smokeless tobacco. She reports current alcohol use of about 3.0 standard drinks of alcohol per week. She reports that she does not use drugs. She is an Chiropodist.   Past Medical History:  Diagnosis Date  . Anxiety   . Depression   . Dizziness   . GERD (gastroesophageal reflux disease)   . Migraine    with aura around menses  . Ovarian cancer ([place]) 2008   Stage 1A    Past Surgical History:  Procedure Laterality Date  . APPENDECTOMY  08/2006  . BUNIONECTOMY  02/2007  . CYSTECTOMY  07/2008   from right ovary  . ROBOTIC ASSISTED LAPAROSCOPIC OVARIAN CYSTECTOMY Right 06/14/2015   Procedure: ROBOTIC LAPAROSCOPIC RIGHT OVARIAN CYSTECTOMY/;  Surgeon: Everitt Amber, MD;  Location: [facility];   Service: Gynecology;  Laterality: Right;  . SALPINGOOPHORECTOMY  07/2006   Left for Stage 1A ovarian cancer  . WISDOM TOOTH EXTRACTION  2015    Current Outpatient Medications  Medication Sig Dispense Refill  . acetaminophen (TYLENOL) 325 MG tablet Take 650 mg by mouth every 6 (six) hours as needed.    . ALPRAZolam (XANAX) 0.5 MG tablet Take 0.5 mg by mouth 2 (two) times daily as needed for anxiety (take 0.5-1 tabs as needed).    Marland Kitchen buPROPion (WELLBUTRIN XL) 300 MG 24 hr tablet Take 1 tablet (300 mg total) by mouth every morning. 90 tablet 1  . Loratadine (CLARITIN) 10 MG CAPS Take by mouth.    . Multiple Vitamins-Minerals (MULTIVITAMIN WITH MINERALS) tablet Take 1 tablet by mouth daily.    Marland Kitchen vortioxetine HBr (TRINTELLIX) 10 MG TABS tablet Take 1 tablet (10 mg total) by mouth daily. TAKE 1 TAB DAILY 90 tablet 1   No current facility-administered medications for this visit.     Family History  Problem Relation Age of Onset  . Heart attack Maternal Grandfather   . Heart disease Maternal Grandfather        Heart attack; CAD   . Fibromyalgia Mother   . Thyroid disease Mother   . Blindness Father   . Alcohol abuse Father   . Diabetes Maternal Aunt     Review of Systems  Constitutional: Negative.   HENT: Negative.   Eyes: Negative.   Respiratory: Negative.   Cardiovascular: Negative.   Gastrointestinal:       Abdominal tenderness  Endocrine: Negative.   Genitourinary: Negative.   Musculoskeletal: Negative.   Skin: Negative.   Allergic/Immunologic: Negative.   Neurological: Negative.   Hematological: Negative.   Psychiatric/Behavioral: Negative.     Exam:   BP 126/84 (BP Location: Right Arm, Patient Position: Sitting, Cuff Size: Normal)   Pulse 84   Temp (!) 97.4 F (36.3 C) (Skin)   Ht 5' (1.524 m)   Wt 136 lb (61.7 kg)   LMP  (LMP Unknown)   BMI 26.56 kg/m   Weight change: @WEIGHTCHANGE @ Height:   Height: 5' (152.4 cm)  Ht Readings from Last 3 Encounters:  09/27/18  5' (1.524 m)  03/30/17 5' (1.524 m)  12/18/16 4' 11.5" (1.511 m)    General appearance: alert, cooperative and appears stated age Head: Normocephalic, without obvious abnormality, atraumatic Neck: no adenopathy, supple, symmetrical, trachea midline and thyroid normal to inspection and palpation Lungs: clear to auscultation bilaterally Cardiovascular: regular rate and rhythm Breasts: normal appearance, no masses or tenderness Abdomen: soft, non-tender; non distended,  no masses,  no organomegaly Extremities: extremities normal, atraumatic, no cyanosis or edema Skin: Skin color, texture, turgor normal. No rashes or lesions Lymph nodes: Cervical, supraclavicular, and axillary nodes normal. No abnormal inguinal nodes palpated Neurologic: Grossly normal   Pelvic: External genitalia:  no lesions              Urethra:  normal appearing urethra with no masses, tenderness or lesions              Bartholins and Skenes: normal                 Vagina: normal appearing vagina with normal color and discharge, no lesions              Cervix: no lesions               Bimanual Exam:  Uterus:  normal size, contour, position, consistency, mobility, non-tender              Adnexa: no mass, fullness, tenderness               Rectovaginal: Confirms               Anus:  normal sphincter tone, no lesions  Chaperone was present for exam.  A:  Well Woman with normal exam  Pelvic/lower abdominal pain  Mild constipation, no change  H/O ovarian cancer  H/O endometriosis  H/O elevated CA 125  P:   No pap this year  Discussed breast self exam  Discussed calcium and vit D intake  Screening labs  Vit d   CA 125  Return for pelvic ultrasound  Has appointment with Dr Chalmers Cater Gardasil series   CC: Dr Denman George, Dr Osborne Casco

## 2018-09-27 NOTE — Telephone Encounter (Signed)
Call placed to review benefit and scheduled recommended ultrasound with Dr Talbert [name]. Left voicemail message requesting a return call

## 2018-09-27 NOTE — Patient Instructions (Signed)
EXERCISE AND DIET:  We recommended that you start or continue a regular exercise program for good health. Regular exercise means any activity that makes your heart beat faster and makes you sweat.  We recommend exercising at least 30 minutes per day at least 3 days a week, preferably 4 or 5.  We also recommend a diet low in fat and sugar.  Inactivity, poor dietary choices and obesity can cause diabetes, heart attack, stroke, and kidney damage, among others.    ALCOHOL AND SMOKING:  Women should limit their alcohol intake to no more than 7 drinks/beers/glasses of wine (combined, not each!) per week. Moderation of alcohol intake to this level decreases your risk of breast cancer and liver damage. And of course, no recreational drugs are part of a healthy lifestyle.  And absolutely no smoking or even second hand smoke. Most people know smoking can cause heart and lung diseases, but did you know it also contributes to weakening of your bones? Aging of your skin?  Yellowing of your teeth and nails?  CALCIUM AND VITAMIN D:  Adequate intake of calcium and Vitamin D are recommended.  The recommendations for exact amounts of these supplements seem to change often, but generally speaking 1,000 mg of calcium (between diet and supplement) and 800 units of Vitamin D per day seems prudent. Certain women may benefit from higher intake of Vitamin D.  If you are among these women, your doctor will have told you during your visit.    PAP SMEARS:  Pap smears, to check for cervical cancer or precancers,  have traditionally been done yearly, although recent scientific advances have shown that most women can have pap smears less often.  However, every woman still should have a physical exam from her gynecologist every year. It will include a breast check, inspection of the vulva and vagina to check for abnormal growths or skin changes, a visual exam of the cervix, and then an exam to evaluate the size and shape of the uterus and  ovaries.  And after 36 years of age, a rectal exam is indicated to check for rectal cancers. We will also provide age appropriate advice regarding health maintenance, like when you should have certain vaccines, screening for sexually transmitted diseases, bone density testing, colonoscopy, mammograms, etc.   MAMMOGRAMS:  All women over 40 years old should have a yearly mammogram. Many facilities now offer a "3D" mammogram, which may cost around $50 extra out of pocket. If possible,  we recommend you accept the option to have the 3D mammogram performed.  It both reduces the number of women who will be called back for extra views which then turn out to be normal, and it is better than the routine mammogram at detecting truly abnormal areas.    COLON CANCER SCREENING: Now recommend starting at age 45. At this time colonoscopy is not covered for routine screening until 50. There are take home tests that can be done between 45-49.   COLONOSCOPY:  Colonoscopy to screen for colon cancer is recommended for all women at age 50.  We know, you hate the idea of the prep.  We agree, BUT, having colon cancer and not knowing it is worse!!  Colon cancer so often starts as a polyp that can be seen and removed at colonscopy, which can quite literally save your life!  And if your first colonoscopy is normal and you have no family history of colon cancer, most women don't have to have it again for   10 years.  Once every ten years, you can do something that may end up saving your life, right?  We will be happy to help you get it scheduled when you are ready.  Be sure to check your insurance coverage so you understand how much it will cost.  It may be covered as a preventative service at no cost, but you should check your particular policy.      Breast Self-Awareness Breast self-awareness means being familiar with how your breasts look and feel. It involves checking your breasts regularly and reporting any changes to your  health care provider. Practicing breast self-awareness is important. A change in your breasts can be a sign of a serious medical problem. Being familiar with how your breasts look and feel allows you to find any problems early, when treatment is more likely to be successful. All women should practice breast self-awareness, including women who have had breast implants. How to do a breast self-exam One way to learn what is normal for your breasts and whether your breasts are changing is to do a breast self-exam. To do a breast self-exam: Look for Changes  1. Remove all the clothing above your waist. 2. Stand in front of a mirror in a room with good lighting. 3. Put your hands on your hips. 4. Push your hands firmly downward. 5. Compare your breasts in the mirror. Look for differences between them (asymmetry), such as: ? Differences in shape. ? Differences in size. ? Puckers, dips, and bumps in one breast and not the other. 6. Look at each breast for changes in your skin, such as: ? Redness. ? Scaly areas. 7. Look for changes in your nipples, such as: ? Discharge. ? Bleeding. ? Dimpling. ? Redness. ? A change in position. Feel for Changes Carefully feel your breasts for lumps and changes. It is best to do this while lying on your back on the floor and again while sitting or standing in the shower or tub with soapy water on your skin. Feel each breast in the following way:  Place the arm on the side of the breast you are examining above your head.  Feel your breast with the other hand.  Start in the nipple area and make  inch (2 cm) overlapping circles to feel your breast. Use the pads of your three middle fingers to do this. Apply light pressure, then medium pressure, then firm pressure. The light pressure will allow you to feel the tissue closest to the skin. The medium pressure will allow you to feel the tissue that is a little deeper. The firm pressure will allow you to feel the tissue  close to the ribs.  Continue the overlapping circles, moving downward over the breast until you feel your ribs below your breast.  Move one finger-width toward the center of the body. Continue to use the  inch (2 cm) overlapping circles to feel your breast as you move slowly up toward your collarbone.  Continue the up and down exam using all three pressures until you reach your armpit.  Write Down What You Find  Write down what is normal for each breast and any changes that you find. Keep a written record with breast changes or normal findings for each breast. By writing this information down, you do not need to depend only on memory for size, tenderness, or location. Write down where you are in your menstrual cycle, if you are still menstruating. If you are having trouble noticing differences   in your breasts, do not get discouraged. With time you will become more familiar with the variations in your breasts and more comfortable with the exam. How often should I examine my breasts? Examine your breasts every month. If you are breastfeeding, the best time to examine your breasts is after a feeding or after using a breast pump. If you menstruate, the best time to examine your breasts is 5-7 days after your period is over. During your period, your breasts are lumpier, and it may be more difficult to notice changes. When should I see my health care provider? See your health care provider if you notice:  A change in shape or size of your breasts or nipples.  A change in the skin of your breast or nipples, such as a reddened or scaly area.  Unusual discharge from your nipples.  A lump or thick area that was not there before.  Pain in your breasts.  Anything that concerns you.  

## 2018-09-29 ENCOUNTER — Other Ambulatory Visit: Payer: Self-pay | Admitting: Obstetrics and Gynecology

## 2018-09-29 DIAGNOSIS — E785 Hyperlipidemia, unspecified: Secondary | ICD-10-CM

## 2018-09-29 LAB — COMPREHENSIVE METABOLIC PANEL
ALT: 20 IU/L (ref 0–32)
AST: 20 IU/L (ref 0–40)
Albumin/Globulin Ratio: 1.8 (ref 1.2–2.2)
Albumin: 4.5 g/dL (ref 3.8–4.8)
Alkaline Phosphatase: 86 IU/L (ref 39–117)
BUN/Creatinine Ratio: 15 (ref 9–23)
BUN: 12 mg/dL (ref 6–20)
Bilirubin Total: 0.2 mg/dL (ref 0.0–1.2)
CO2: 19 mmol/L — ABNORMAL LOW (ref 20–29)
Calcium: 9.8 mg/dL (ref 8.7–10.2)
Chloride: 105 mmol/L (ref 96–106)
Creatinine, Ser: 0.79 mg/dL (ref 0.57–1.00)
GFR calc Af Amer: 111 mL/min/{1.73_m2} (ref >59)
GFR calc non Af Amer: 97 mL/min/{1.73_m2} (ref >59)
Globulin, Total: 2.5 g/dL (ref 1.5–4.5)
Glucose: 71 mg/dL (ref 65–99)
Potassium: 4.4 mmol/L (ref 3.5–5.2)
Sodium: 140 mmol/L (ref 134–144)
Total Protein: 7 g/dL (ref 6.0–8.5)

## 2018-09-29 LAB — CBC
Hematocrit: 39.8 % (ref 34.0–46.6)
Hemoglobin: 13.4 g/dL (ref 11.1–15.9)
MCH: 31.8 pg (ref 26.6–33.0)
MCHC: 33.7 g/dL (ref 31.5–35.7)
MCV: 95 fL (ref 79–97)
Platelets: 250 10*3/uL (ref 150–450)
RBC: 4.21 x10E6/uL (ref 3.77–5.28)
RDW: 13.4 % (ref 11.7–15.4)
WBC: 8.4 10*3/uL (ref 3.4–10.8)

## 2018-09-29 LAB — LIPID PANEL
Chol/HDL Ratio: 4.6 ratio — ABNORMAL HIGH (ref 0.0–4.4)
Cholesterol, Total: 217 mg/dL — ABNORMAL HIGH (ref 100–199)
HDL: 47 mg/dL (ref >39)
LDL Calculated: 123 mg/dL — ABNORMAL HIGH (ref 0–99)
Triglycerides: 233 mg/dL — ABNORMAL HIGH (ref 0–149)
VLDL Cholesterol Cal: 47 mg/dL — ABNORMAL HIGH (ref 5–40)

## 2018-09-29 LAB — VITAMIN D 25 HYDROXY (VIT D DEFICIENCY, FRACTURES): Vit D, 25-Hydroxy: 19.2 ng/mL — ABNORMAL LOW (ref 30.0–100.0)

## 2018-09-29 LAB — CA 125: Cancer Antigen (CA) 125: 63.8 U/mL — ABNORMAL HIGH (ref 0.0–38.1)

## 2018-09-29 NOTE — Telephone Encounter (Signed)
Spoke with patient regarding benefit for an ultrasound. Patient understood and agreeable. Patient ready to schedule. Patient scheduled  10/12/2018 with Dr Talbert [name]. Patient is aware of appointment date, arrival time and cancellation policy. No further questions.   Forwarding to Dr Talbert [name] for final review. Patient is agreeable to disposition. Will close encounter

## 2018-10-07 NOTE — Progress Notes (Deleted)
GYNECOLOGY  VISIT   HPI: 38 y.o.   Single White or Caucasian Not Hispanic or Latino  female   [name] with No LMP recorded (lmp unknown).   here for     GYNECOLOGIC HISTORY: No LMP recorded (lmp unknown). Contraception:*** Menopausal hormone therapy: ***        OB History    Gravida  0   Para  0   Term  0   Preterm  0   AB  0   Living  0     SAB  0   TAB  0   Ectopic  0   Multiple  0   Live Births                 Patient Active Problem List   Diagnosis Date Noted  . Major depressive disorder, recurrent episode, moderate ([place]) 12/27/2017  . Generalized anxiety disorder 12/27/2017  . Cyst of right ovary   . Endometriosis of ovary   . Migraines 02/20/2015  . Ovarian cancer ([place]) 11/06/2011    Past Medical History:  Diagnosis Date  . Anxiety   . Depression   . Dizziness   . GERD (gastroesophageal reflux disease)   . Migraine    with aura around menses  . Ovarian cancer ([place]) 2008   Stage 1A    Past Surgical History:  Procedure Laterality Date  . APPENDECTOMY  08/2006  . BUNIONECTOMY  02/2007  . CYSTECTOMY  07/2008   from right ovary  . ROBOTIC ASSISTED LAPAROSCOPIC OVARIAN CYSTECTOMY Right 06/14/2015   Procedure: ROBOTIC LAPAROSCOPIC RIGHT OVARIAN CYSTECTOMY/;  Surgeon: Everitt Amber, MD;  Location: [facility];  Service: Gynecology;  Laterality: Right;  . SALPINGOOPHORECTOMY  07/2006   Left for Stage 1A ovarian cancer  . WISDOM TOOTH EXTRACTION  2015    Current Outpatient Medications  Medication Sig Dispense Refill  . acetaminophen (TYLENOL) 325 MG tablet Take 650 mg by mouth every 6 (six) hours as needed.    . ALPRAZolam (XANAX) 0.5 MG tablet Take 0.5 mg by mouth 2 (two) times daily as needed for anxiety (take 0.5-1 tabs as needed).    Marland Kitchen buPROPion (WELLBUTRIN XL) 300 MG 24 hr tablet Take 1 tablet (300 mg total) by mouth every morning. 90 tablet 1  . Loratadine (CLARITIN) 10 MG CAPS Take by mouth.    . Multiple Vitamins-Minerals (MULTIVITAMIN WITH  MINERALS) tablet Take 1 tablet by mouth daily.    Marland Kitchen vortioxetine HBr (TRINTELLIX) 10 MG TABS tablet Take 1 tablet (10 mg total) by mouth daily. TAKE 1 TAB DAILY 90 tablet 1   No current facility-administered medications for this visit.      ALLERGIES: Amoxicillin, Aspirin, and Nsaids  Family History  Problem Relation Age of Onset  . Heart attack Maternal Grandfather   . Heart disease Maternal Grandfather        Heart attack; CAD   . Fibromyalgia Mother   . Thyroid disease Mother   . Blindness Father   . Alcohol abuse Father   . Diabetes Maternal Aunt     Social History   Socioeconomic History  . Marital status: Single    Spouse name: Not on file  . Number of children: Not on file  . Years of education: Not on file  . Highest education level: Not on file  Occupational History  . Not on file  Social Needs  . Financial resource strain: Not on file  . Food insecurity    Worry: Not on file  Inability: Not on file  . Transportation needs    Medical: Not on file    Non-medical: Not on file  Tobacco Use  . Smoking status: Never Smoker  . Smokeless tobacco: Never Used  Substance and Sexual Activity  . Alcohol use: Yes    Alcohol/week: 3.0 standard drinks    Types: 3 Standard drinks or equivalent per week    Comment: average per week  . Drug use: No  . Sexual activity: Not Currently    Partners: Male    Birth control/protection: Abstinence  Lifestyle  . Physical activity    Days per week: Not on file    Minutes per session: Not on file  . Stress: Not on file  Relationships  . Social Herbalist on phone: Not on file    Gets together: Not on file    Attends religious service: Not on file    Active member of club or organization: Not on file    Attends meetings of clubs or organizations: Not on file    Relationship status: Not on file  . Intimate partner violence    Fear of current or ex partner: Not on file    Emotionally abused: Not on file     Physically abused: Not on file    Forced sexual activity: Not on file  Other Topics Concern  . Not on file  Social History Narrative   [place]   0 children   Exercise-treadmill, weights 1-2 times per week    ROS  PHYSICAL EXAMINATION:    LMP  (LMP Unknown)     General appearance: alert, cooperative and appears stated age Neck: no adenopathy, supple, symmetrical, trachea midline and thyroid {CHL AMB PHY EX THYROID NORM [phone]::"normal to inspection and palpation"} Breasts: {Exam; breast:13139::"normal appearance, no masses or tenderness"} Abdomen: soft, non-tender; non distended, no masses,  no organomegaly  Pelvic: External genitalia:  no lesions              Urethra:  normal appearing urethra with no masses, tenderness or lesions              Bartholins and Skenes: normal                 Vagina: normal appearing vagina with normal color and discharge, no lesions              Cervix: {CHL AMB PHY EX CERVIX NORM DEFAULT:[phone]::"no lesions"}              Bimanual Exam:  Uterus:  {CHL AMB PHY EX UTERUS NORM DEFAULT:[phone]::"normal size, contour, position, consistency, mobility, non-tender"}              Adnexa: {CHL AMB PHY EX ADNEXA NO MASS DEFAULT:[phone]::"no mass, fullness, tenderness"}              Rectovaginal: {yes no:[id number]}.  Confirms.              Anus:  normal sphincter tone, no lesions  Chaperone was present for exam.  ASSESSMENT     PLAN    An After Visit Summary was printed and given to the patient.  *** minutes face to face time of which over 50% was spent in counseling.

## 2018-10-11 ENCOUNTER — Encounter: Payer: Self-pay | Admitting: Gynecologic Oncology

## 2018-10-11 ENCOUNTER — Other Ambulatory Visit: Payer: Self-pay

## 2018-10-11 ENCOUNTER — Inpatient Hospital Stay: Payer: BC Managed Care – PPO | Attending: Gynecologic Oncology | Admitting: Gynecologic Oncology

## 2018-10-11 VITALS — BP 106/72 | HR 87 | Temp 98.7°F | Resp 17 | Ht 60.0 in | Wt 134.0 lb

## 2018-10-11 DIAGNOSIS — K219 Gastro-esophageal reflux disease without esophagitis: Secondary | ICD-10-CM | POA: Diagnosis not present

## 2018-10-11 DIAGNOSIS — N809 Endometriosis, unspecified: Secondary | ICD-10-CM | POA: Insufficient documentation

## 2018-10-11 DIAGNOSIS — C562 Malignant neoplasm of left ovary: Secondary | ICD-10-CM

## 2018-10-11 DIAGNOSIS — F419 Anxiety disorder, unspecified: Secondary | ICD-10-CM | POA: Insufficient documentation

## 2018-10-11 DIAGNOSIS — F329 Major depressive disorder, single episode, unspecified: Secondary | ICD-10-CM | POA: Diagnosis not present

## 2018-10-11 NOTE — Progress Notes (Signed)
New Patient Note: Gyn-Onc  Sharon Holmes 38 y.o. female  CC:  Chief Complaint  Patient presents with  . Malignant neoplasm of left ovary [facility])    Assessment/Plan: 38 year old with a stage IA unknown histology ovarian carcinoma diagnosed and treated in 2008.  Endometriosis and history of right ovarian cyst (March, 2017).   We will follow-up the results of her ultrasound scheduled for later this week.  If this shows a complex adnexal mass, options would be to wait and repeat imaging in 3 months versus proceed with surgery to excise the area.  I favor a lesion being an endometrioma if one is seen.  Given that she has remained cancer free for greater than 10 years, she does not require specific GYN follow-up.  She can follow-up annually with Dr. Talbert [name] for a wellness visit.  She is interested in preserving ovarian function until age of natural menopause, therefore she can have consideration for completion surgery at that time, or if a new complex lesion arises in the ovary at that time.  I counseled her that if she did have oophorectomy with or without hysterectomy prior to menopause, it would be safe to replace estrogen for her.  We discussed that the risk for breast cancer is seen in women who continue to take estrogen replacement therapy after age of natural menopause, not for women who undergo surgical castration at a young age.  She will follow-up with the ultrasound with Dr. Talbert [name].  If Dr. Talbert [name] is concerned about the findings on ultrasound desires surgical intervention, I would be happy to see her back.   HPI:   38 year old gravida 0 (DES grand daughter) in 2008 began feeling some fatigue bloating and was having some bleeding every week. She was told her that she might have polycystic ovarian syndrome. She then developed some sharp left-sided shoulder pain went to her local emergency room where an MRI was performed it revealed a 17 cm ovarian mass. She underwent exploratory laparotomy  left salpingo-oophorectomy that revealed a this early stage ovarian cancer in 2008. She does not know the histology and we do not have a record of her final pathology from that surgery. After that she was referred to Dr. Susa Day a gynecologic oncologist in [place]. She underwent complete staging at that time (that pathology is availalble) which was completely negative (including appendectomy) and she was felt to have a stage IA ovarian cancer was dispositioned to close followup. Her CA 125 was elevated at the time of her diagnosis and has been followed closely. Her CA-125 has been elevated from time to time and most recently in October 2011 they were elevated at 74. In 2010 she had an evaluation for a right ovarian mass and underwent a right ovarian cystectomy that revealed endometriosis. She was also diagnosed with left-sided pelvic adhesions. October 23, 2011 CA 125  was elevated at 55.6. Her last Pap smear was normal in 11/06/11.  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 She does have some breast tenderness week before her cycles.She occasionally has some ovarian discomfort she believes corresponds to ovulation in the right lower quadrant. She has mild cramping with her menstrual cycles. She has a brother pain or pressure. There are no new medical problems and her family. Her mother had Graves' disease and had a thyroidectomy and  is currently on Synthroid. She also has fibromyalgia.   She has declined genetic screening. She had a history of anxiety and depression and she has seen a psychiatrist in the past year who prescribed Lexapro. This has improved her symptoms and she noticed weight gain with this (though she had experienced weight loss before this treatment began).  She had an elevated CA 125 when I saw her in 2015, (90's)  however, a CT scan in October, 2015 showed no evidence of recurrence. It is normal for her CA 125 values to wax and wane.  She saw Dr Sumner Boast on 04/10/15 for a symptomatic UTI. During pelvic exam a mass was appreciated on bimanual exam. A TVUS was ordered and revealed a surgically absent left ovary. The right ovary contained a 2.8 x 1.9 cm echogenic mass with no internal blood flow, homogeneous throughout. It was felt to represent either a hemorrhagic cyst versus dermoid versus an endometrioma. No free fluid was seen. She is otherwise asymptomatic. On 05/31/15 she underwent repeat CA 125 draw which was elevated to 658 (consistent rise from 26 in 2014, to 94 in 2015, now to 658). [place]  On 05/31/15 showed a right ovary with a cystic mass measuring 24x21x72mm with low level echoes, avascular, smooth borders.  CT scan of the abdomen and pelvis on 06/07/15 showed 3.3cm right ovarian cyst which overall has benign features. No extraovarian disease was identified.:  On 06/14/15 she underwent robotic assisted right ovarian cystectomy. Intraoperative findings include:d an enlarged (6cm) right ovary with dominant cyst (chocolate fluid filled) which was an endometrioma on frozen section. There were multiple (approximately 6) 1-2cm simple cysts in the ovary also removed. All unavoidably ruptured during extraction. There was no evidence of recurrent cancer in the peritoneal cavity (normal uterus, surgically absent left tube and ovary, normal omentum, normal peritoneum, normal diaphragms). Surgically absent appendix. There were endoemtriosis powderburn lesions on the posterior uterus.  Final pathology revealed: benign serous cystadenomas of the right ovary. No malignancy.    Interval History I had not seen Azhane since 2017.  She spent a couple of years without follow-up with Dr. Wynetta Emery but most recently return to see her in July 2020.  She reported some mild vague abdominal bloating, which was subsequently resolved with  the onset of her menstrual cycle.  During that time of those symptoms of bloating she had a Ca1 25 drawn which was elevated at 63.8.   Review of Systems:   Constitutional: Feeling well Skin: No rash, sores, jaundice, itching, or dryness.  Cardiovascular: No chest pain, shortness of breath, or edema  Pulmonary: No cough or wheeze.  Gastro Intestinal: No nausea, vomiting, constipation, or diarrhea reported. No bright red blood per rectum or change in bowel movement.  Genitourinary: No frequency, urgency, or dysuria.  Denies vaginal bleeding and discharge.  Musculoskeletal: No myalgia, arthralgia, joint swelling or pain.  Neurologic: No weakness, numbness, or change in gait.  Psychology: No issues .  Current Meds:  Outpatient Encounter Medications as of 10/11/2018  Medication Sig  . acetaminophen (TYLENOL) 500 MG tablet Take 500 mg by mouth every 6 (six) hours as needed.  . ALPRAZolam (XANAX) 0.5 MG tablet Take 0.5 mg by mouth 2 (two) times daily as needed for anxiety (take 0.5-1 tabs as needed).  Marland Kitchen buPROPion (WELLBUTRIN XL) 300 MG 24 hr tablet Take 1 tablet (300 mg total) by mouth every morning.  . Cyanocobalamin (B-12) 2500 MCG SUBL Place 1 drop under the tongue daily.  . Ergocalciferol (  VITAMIN D2) 50 MCG (2000 UT) TABS Take 1 tablet by mouth daily.  Marland Kitchen FIBER SELECT GUMMIES PO Take by mouth. Takes 2 gummies a day.  . Loratadine (CLARITIN) 10 MG CAPS Take by mouth.  . Multiple Vitamins-Minerals (MULTIVITAMIN WITH MINERALS) tablet Take 1 tablet by mouth daily.  Marland Kitchen vortioxetine HBr (TRINTELLIX) 10 MG TABS tablet Take 1 tablet (10 mg total) by mouth daily. TAKE 1 TAB DAILY  . [DISCONTINUED] acetaminophen (TYLENOL) 325 MG tablet Take 650 mg by mouth every 6 (six) hours as needed.   No facility-administered encounter medications on file as of 10/11/2018.     Allergy:  Allergies  Allergen Reactions  . Amoxicillin     Has patient had a PCN reaction causing immediate rash, facial/tongue/throat  swelling, SOB or lightheadedness with hypotension: No Has patient had a PCN reaction causing severe rash involving mucus membranes or skin necrosis: No Has patient had a PCN reaction that required hospitalization No Has patient had a PCN reaction occurring within the last 10 years:NO If all of the above answers are "NO", then may proceed with Cephalosporin use. Severe leg cramps and high fever  . Aspirin     Throat gets itchy  . Nsaids Itching    Itchy throat and gums, throat closes up; palpitations     Social Hx:   Social History   Socioeconomic History  . Marital status: Single    Spouse name: Not on file  . Number of children: Not on file  . Years of education: Not on file  . Highest education level: Not on file  Occupational History  . Not on file  Social Needs  . Financial resource strain: Not on file  . Food insecurity    Worry: Not on file    Inability: Not on file  . Transportation needs    Medical: Not on file    Non-medical: Not on file  Tobacco Use  . Smoking status: Never Smoker  . Smokeless tobacco: Never Used  Substance and Sexual Activity  . Alcohol use: Yes    Alcohol/week: 3.0 standard drinks    Types: 3 Standard drinks or equivalent per week    Comment: average per week  . Drug use: No  . Sexual activity: Not Currently    Partners: Male    Birth control/protection: Abstinence  Lifestyle  . Physical activity    Days per week: Not on file    Minutes per session: Not on file  . Stress: Not on file  Relationships  . Social Herbalist on phone: Not on file    Gets together: Not on file    Attends religious service: Not on file    Active member of club or organization: Not on file    Attends meetings of clubs or organizations: Not on file    Relationship status: Not on file  . Intimate partner violence    Fear of current or ex partner: Not on file    Emotionally abused: Not on file    Physically abused: Not on file    Forced sexual  activity: Not on file  Other Topics Concern  . Not on file  Social History Narrative   [place]   0 children   Exercise-treadmill, weights 1-2 times per week    Past Surgical Hx:  Past Surgical History:  Procedure Laterality Date  . APPENDECTOMY  08/2006  . BUNIONECTOMY  02/2007  . CYSTECTOMY  07/2008  from right ovary  . ROBOTIC ASSISTED LAPAROSCOPIC OVARIAN CYSTECTOMY Right 06/14/2015   Procedure: ROBOTIC LAPAROSCOPIC RIGHT OVARIAN CYSTECTOMY/;  Surgeon: Everitt Amber, MD;  Location: [facility];  Service: Gynecology;  Laterality: Right;  . SALPINGOOPHORECTOMY  07/2006   Left for Stage 1A ovarian cancer  . WISDOM TOOTH EXTRACTION  2015    Past Medical Hx:  Past Medical History:  Diagnosis Date  . Anxiety   . Depression   . Dizziness   . GERD (gastroesophageal reflux disease)   . Migraine    with aura around menses  . Ovarian cancer ([place]) 2008   Stage 1A    Family Hx:  Family History  Problem Relation Age of Onset  . Heart attack Maternal Grandfather   . Heart disease Maternal Grandfather        Heart attack; CAD   . Fibromyalgia Mother   . Thyroid disease Mother   . Blindness Father   . Alcohol abuse Father   . Diabetes Maternal Aunt     Vitals:  Blood pressure 106/72, pulse 87, temperature 98.7 F (37.1 C), temperature source Oral, resp. rate 17, height 5' (1.524 m), weight 134 lb (60.8 kg), SpO2 98 %.  Physical Exam: Well-nourished well-developed female in no acute distress.  Neck: Supple, no lymphadenopathy, no family.  Lungs: Clear to auscultation bilaterally.  Cardiovascular: Regular rate and rhythm.  Breasts: deferred  Abdomen: [place]. Well-healed laparoscopic incisions.There is no evidence of any incisional hernias. Abdomen is soft and nontender.  Groins: No lymphadenopathy.  Extremities: No edema.  Pelvic:  Normal external genitalia, normal vagina and cervix, normal urethral meatus. Uterus palpably  normal, no palpable adnexal masses.     Thereasa Solo, MD 10/11/2018, 5:05 PM

## 2018-10-11 NOTE — Patient Instructions (Signed)
Dr Denman George recommends an ultrasound to re-evaluate the ovary given the elevated tumor marker. Provided that this is reassuring, you can have a repeat assessment in 1 year. If there are cysts or masses on the ultrasound, it should be repeated in 3 months with a repeat CA 125 and consideration for surgery if the cysts are persisting and concerning in appearance.  It is reasonable to have one annual gyn visit with Dr Talbert [name] per year. Gyn Onc follow-up with Denman George can be reserved for when there is a concerning lab or ultrasound finding.

## 2018-10-12 ENCOUNTER — Other Ambulatory Visit: Payer: Self-pay | Admitting: Obstetrics and Gynecology

## 2018-10-12 ENCOUNTER — Other Ambulatory Visit: Payer: BC Managed Care – PPO

## 2018-10-13 NOTE — Progress Notes (Signed)
GYNECOLOGY  VISIT   HPI: 38 y.o.   Single White or Caucasian Not Hispanic or Latino  female   [name] with Patient's last menstrual period was 09/29/2018.   here for a pelvic ultrasound. She has a h/o stage IA ovarian cancer with  LSO in 2008. In 3/17 she had a laparoscopy with Dr Denman George with treatment of endometriosis and a right ovarian cystectomy. Recent CA 125 was 63.8 . She just saw Dr Denman George last week.  GYNECOLOGIC HISTORY: Patient's last menstrual period was 09/29/2018. Contraception: Abstains Menopausal hormone therapy: None        OB History    Gravida  0   Para  0   Term  0   Preterm  0   AB  0   Living  0     SAB  0   TAB  0   Ectopic  0   Multiple  0   Live Births                 Patient Active Problem List   Diagnosis Date Noted  . Major depressive disorder, recurrent episode, moderate ([place]) 12/27/2017  . Generalized anxiety disorder 12/27/2017  . Cyst of right ovary   . Endometriosis of ovary   . Migraines 02/20/2015  . Ovarian cancer ([place]) 11/06/2011    Past Medical History:  Diagnosis Date  . Anxiety   . Depression   . Dizziness   . GERD (gastroesophageal reflux disease)   . Migraine    with aura around menses  . Ovarian cancer ([place]) 2008   Stage 1A    Past Surgical History:  Procedure Laterality Date  . APPENDECTOMY  08/2006  . BUNIONECTOMY  02/2007  . CYSTECTOMY  07/2008   from right ovary  . ROBOTIC ASSISTED LAPAROSCOPIC OVARIAN CYSTECTOMY Right 06/14/2015   Procedure: ROBOTIC LAPAROSCOPIC RIGHT OVARIAN CYSTECTOMY/;  Surgeon: Everitt Amber, MD;  Location: [facility];  Service: Gynecology;  Laterality: Right;  . SALPINGOOPHORECTOMY  07/2006   Left for Stage 1A ovarian cancer  . WISDOM TOOTH EXTRACTION  2015    Current Outpatient Medications  Medication Sig Dispense Refill  . acetaminophen (TYLENOL) 500 MG tablet Take 500 mg by mouth every 6 (six) hours as needed.    . ALPRAZolam (XANAX) 0.5 MG tablet Take 0.5 mg by mouth 2 (two)  times daily as needed for anxiety (take 0.5-1 tabs as needed).    Marland Kitchen buPROPion (WELLBUTRIN XL) 300 MG 24 hr tablet Take 1 tablet (300 mg total) by mouth every morning. 90 tablet 1  . Cyanocobalamin (B-12) 2500 MCG SUBL Place 1 drop under the tongue daily.    . Ergocalciferol (VITAMIN D2) 50 MCG (2000 UT) TABS Take 1 tablet by mouth daily.    Marland Kitchen FIBER SELECT GUMMIES PO Take by mouth. Takes 2 gummies a day.    . Loratadine (CLARITIN) 10 MG CAPS Take by mouth.    . Multiple Vitamins-Minerals (MULTIVITAMIN WITH MINERALS) tablet Take 1 tablet by mouth daily.    Marland Kitchen vortioxetine HBr (TRINTELLIX) 10 MG TABS tablet Take 1 tablet (10 mg total) by mouth daily. TAKE 1 TAB DAILY 90 tablet 1   No current facility-administered medications for this visit.      ALLERGIES: Amoxicillin, Aspirin, and Nsaids  Family History  Problem Relation Age of Onset  . Heart attack Maternal Grandfather   . Heart disease Maternal Grandfather        Heart attack; CAD   . Fibromyalgia Mother   .  Thyroid disease Mother   . Blindness Father   . Alcohol abuse Father   . Diabetes Maternal Aunt     Social History   Socioeconomic History  . Marital status: Single    Spouse name: Not on file  . Number of children: Not on file  . Years of education: Not on file  . Highest education level: Not on file  Occupational History  . Not on file  Social Needs  . Financial resource strain: Not on file  . Food insecurity    Worry: Not on file    Inability: Not on file  . Transportation needs    Medical: Not on file    Non-medical: Not on file  Tobacco Use  . Smoking status: Never Smoker  . Smokeless tobacco: Never Used  Substance and Sexual Activity  . Alcohol use: Yes    Alcohol/week: 3.0 standard drinks    Types: 3 Standard drinks or equivalent per week    Comment: average per week  . Drug use: No  . Sexual activity: Not Currently    Partners: Male    Birth control/protection: Abstinence  Lifestyle  . Physical  activity    Days per week: Not on file    Minutes per session: Not on file  . Stress: Not on file  Relationships  . Social Herbalist on phone: Not on file    Gets together: Not on file    Attends religious service: Not on file    Active member of club or organization: Not on file    Attends meetings of clubs or organizations: Not on file    Relationship status: Not on file  . Intimate partner violence    Fear of current or ex partner: Not on file    Emotionally abused: Not on file    Physically abused: Not on file    Forced sexual activity: Not on file  Other Topics Concern  . Not on file  Social History Narrative   [place]   0 children   Exercise-treadmill, weights 1-2 times per week    Review of Systems  Constitutional: Negative.   HENT: Negative.   Eyes: Negative.   Respiratory: Negative.   Cardiovascular: Negative.   Gastrointestinal: Negative.   Genitourinary: Negative.   Musculoskeletal: Negative.   Skin: Negative.   Neurological: Negative.   Endo/Heme/Allergies: Negative.   Psychiatric/Behavioral: Negative.     PHYSICAL EXAMINATION:    BP 118/82 (BP Location: Right Arm, Patient Position: Sitting, Cuff Size: Normal)   Pulse 80   Temp 98.1 F (36.7 C) (Skin)   Wt 136 lb (61.7 kg)   LMP 09/29/2018   BMI 26.56 kg/m     General appearance: alert, cooperative and appears stated age  Ultrasound images reviewed with the patient.  ASSESSMENT H/O ovarian cancer over 10 years ago. She was seen by Dr Denman George last week. She stated that since she has been cancer free for over 10 years, she doesn't need any specific GYN f/u, just routine annual exams. Elevated CA 125, likely from endometriosis. Ultrasound today with possible hydrosalpinx, no concerning findings.    PLAN F/U in 7/21 for an annual exam Dr Denman George told the patient to continue with yearly CA 125's and to go back to her if it was over 200   An After  Visit Summary was printed and given to the patient.  CC: Dr Denman George

## 2018-10-14 ENCOUNTER — Other Ambulatory Visit: Payer: Self-pay | Admitting: *Deleted

## 2018-10-14 ENCOUNTER — Encounter: Payer: Self-pay | Admitting: Obstetrics and Gynecology

## 2018-10-14 ENCOUNTER — Ambulatory Visit: Payer: BC Managed Care – PPO | Admitting: Obstetrics and Gynecology

## 2018-10-14 ENCOUNTER — Other Ambulatory Visit: Payer: Self-pay

## 2018-10-14 ENCOUNTER — Ambulatory Visit (INDEPENDENT_AMBULATORY_CARE_PROVIDER_SITE_OTHER): Payer: BC Managed Care – PPO

## 2018-10-14 VITALS — BP 118/82 | HR 80 | Temp 98.1°F | Wt 136.0 lb

## 2018-10-14 DIAGNOSIS — R971 Elevated cancer antigen 125 [CA 125]: Secondary | ICD-10-CM

## 2018-10-14 DIAGNOSIS — C562 Malignant neoplasm of left ovary: Secondary | ICD-10-CM | POA: Diagnosis not present

## 2018-10-14 DIAGNOSIS — Z8742 Personal history of other diseases of the female genital tract: Secondary | ICD-10-CM | POA: Diagnosis not present

## 2018-10-14 DIAGNOSIS — Z8543 Personal history of malignant neoplasm of ovary: Secondary | ICD-10-CM

## 2018-11-11 ENCOUNTER — Ambulatory Visit: Payer: BC Managed Care – PPO | Admitting: Physician Assistant

## 2018-11-11 ENCOUNTER — Ambulatory Visit (INDEPENDENT_AMBULATORY_CARE_PROVIDER_SITE_OTHER): Payer: BC Managed Care – PPO | Admitting: Adult Health

## 2018-11-11 ENCOUNTER — Encounter: Payer: Self-pay | Admitting: Adult Health

## 2018-11-11 ENCOUNTER — Other Ambulatory Visit: Payer: Self-pay

## 2018-11-11 DIAGNOSIS — F411 Generalized anxiety disorder: Secondary | ICD-10-CM

## 2018-11-11 DIAGNOSIS — F331 Major depressive disorder, recurrent, moderate: Secondary | ICD-10-CM | POA: Diagnosis not present

## 2018-11-11 MED ORDER — VORTIOXETINE HBR 10 MG PO TABS: 10.0000 mg | ORAL_TABLET | Freq: Every day | ORAL | 5 refills | Status: DC

## 2018-11-11 MED ORDER — BUPROPION HCL ER (XL) 300 MG PO TB24: 300.0000 mg | ORAL_TABLET | Freq: Every morning | ORAL | 1 refills | Status: DC

## 2018-11-11 MED ORDER — ALPRAZOLAM 0.5 MG PO TABS: 0.5000 mg | ORAL_TABLET | Freq: Two times a day (BID) | ORAL | 2 refills | Status: DC | PRN

## 2018-11-11 NOTE — Progress Notes (Signed)
Sharon Holmes [id number] September 12, 1980 38 y.o.  Subjective:   Patient ID:  Sharon Holmes is a 38 y.o. (DOB 01-Feb-1981) female.  Chief Complaint:  Chief Complaint  Patient presents with  . Anxiety  . Depression    HPI Sharon Holmes presents to the office today for follow-up of depression and anxiety.   Describes mood today as "ok". Pleasant. Mood symptoms - reports some depression, anxiety, and irritability. Stating "I'm doing pretty good". Feels like Trintellix is causing her to have "constipation" Has added fiber into diet. Does not want to stop Trintellix at this time. Has failed "many other medications". Stable interest and motivation. Taking medications as prescribed.  Energy levels stable. Active, has a regular exercise routine. Bought a rowing machine - uses when she watches TV. Works full time - Animal nutritionist for [employer].  Enjoys some usual interests and activities. Lives alone with cats - 2 orange 39 -both age 88. Talking to family in [place] - video chat. Hoping to visit over Christmas.  Appetite adequate. Weight stable - 135 Sleeps well most nights. Averages 7 to 8 hours. Focus and concentration stable. Completing tasks. Managing aspects of household. Work going well. Works 4 hours at school and 4 hours at home Denies SI or HI. Denies AH or VH.  Past medications for mental health diagnoses include: Prozac, Cymbalta, Effexor, Lexapro, Xanax  Review of Systems:  Review of Systems  Musculoskeletal: Negative for gait problem.  Neurological: Negative for tremors.  Psychiatric/Behavioral:       Please refer to HPI    Medications: I have reviewed the patient's current medications.  Current Outpatient Medications  Medication Sig Dispense Refill  . acetaminophen (TYLENOL) 500 MG tablet Take 500 mg by mouth every 6 (six) hours as needed.    . ALPRAZolam (XANAX) 0.5 MG tablet Take 1 tablet (0.5 mg total) by mouth 2 (two) times daily as needed for anxiety (take 0.5-1 tabs as needed). 30  tablet 2  . buPROPion (WELLBUTRIN XL) 300 MG 24 hr tablet Take 1 tablet (300 mg total) by mouth every morning. 90 tablet 1  . Cyanocobalamin (B-12) 2500 MCG SUBL Place 1 drop under the tongue daily.    . Ergocalciferol (VITAMIN D2) 50 MCG (2000 UT) TABS Take 1 tablet by mouth daily.    Marland Kitchen FIBER SELECT GUMMIES PO Take by mouth. Takes 2 gummies a day.    . Loratadine (CLARITIN) 10 MG CAPS Take by mouth.    . Multiple Vitamins-Minerals (MULTIVITAMIN WITH MINERALS) tablet Take 1 tablet by mouth daily.    Marland Kitchen vortioxetine HBr (TRINTELLIX) 10 MG TABS tablet Take 1 tablet (10 mg total) by mouth daily. TAKE 1 TAB DAILY 90 tablet 1  . vortioxetine HBr (TRINTELLIX) 10 MG TABS tablet Take 1 tablet (10 mg total) by mouth daily. 30 tablet 5   No current facility-administered medications for this visit.     Medication Side Effects: None  Allergies:  Allergies  Allergen Reactions  . Amoxicillin     Has patient had a PCN reaction causing immediate rash, facial/tongue/throat swelling, SOB or lightheadedness with hypotension: No Has patient had a PCN reaction causing severe rash involving mucus membranes or skin necrosis: No Has patient had a PCN reaction that required hospitalization No Has patient had a PCN reaction occurring within the last 10 years:NO If all of the above answers are "NO", then may proceed with Cephalosporin use. Severe leg cramps and high fever  . Aspirin     Throat gets itchy  .  Nsaids Itching    Itchy throat and gums, throat closes up; palpitations     Past Medical History:  Diagnosis Date  . Anxiety   . Depression   . Dizziness   . GERD (gastroesophageal reflux disease)   . Migraine    with aura around menses  . Ovarian cancer ([place]) 2008   Stage 1A    Family History  Problem Relation Age of Onset  . Heart attack Maternal Grandfather   . Heart disease Maternal Grandfather        Heart attack; CAD   . Fibromyalgia Mother   . Thyroid disease Mother   . Blindness  Father   . Alcohol abuse Father   . Diabetes Maternal Aunt     Social History   Socioeconomic History  . Marital status: Single    Spouse name: Not on file  . Number of children: Not on file  . Years of education: Not on file  . Highest education level: Not on file  Occupational History  . Not on file  Social Needs  . Financial resource strain: Not on file  . Food insecurity    Worry: Not on file    Inability: Not on file  . Transportation needs    Medical: Not on file    Non-medical: Not on file  Tobacco Use  . Smoking status: Never Smoker  . Smokeless tobacco: Never Used  Substance and Sexual Activity  . Alcohol use: Yes    Alcohol/week: 3.0 standard drinks    Types: 3 Standard drinks or equivalent per week    Comment: average per week  . Drug use: No  . Sexual activity: Not Currently    Partners: Male    Birth control/protection: Abstinence  Lifestyle  . Physical activity    Days per week: Not on file    Minutes per session: Not on file  . Stress: Not on file  Relationships  . Social Herbalist on phone: Not on file    Gets together: Not on file    Attends religious service: Not on file    Active member of club or organization: Not on file    Attends meetings of clubs or organizations: Not on file    Relationship status: Not on file  . Intimate partner violence    Fear of current or ex partner: Not on file    Emotionally abused: Not on file    Physically abused: Not on file    Forced sexual activity: Not on file  Other Topics Concern  . Not on file  Social History Narrative   [place]   0 children   Exercise-treadmill, weights 1-2 times per week    Past Medical History, Surgical history, Social history, and Family history were reviewed and updated as appropriate.   Please see review of systems for further details on the patient's review from today.   Objective:   Physical Exam:  There were no  vitals taken for this visit.  Physical Exam Constitutional:      General: She is not in acute distress.    Appearance: She is well-developed.  Musculoskeletal:        General: No deformity.  Neurological:     Mental Status: She is alert and oriented to person, place, and time.     Coordination: Coordination normal.  Psychiatric:        Attention and Perception: Attention and perception normal. She does not  perceive auditory or visual hallucinations.        Mood and Affect: Mood normal. Mood is not anxious or depressed. Affect is not labile, blunt, angry or inappropriate.        Speech: Speech normal.        Behavior: Behavior normal.        Thought Content: Thought content normal. Thought content is not paranoid or delusional. Thought content does not include homicidal or suicidal ideation. Thought content does not include homicidal or suicidal plan.        Cognition and Memory: Cognition and memory normal.        Judgment: Judgment normal.     Comments: Insight intact     Lab Review:     Component Value Date/Time   NA 140 09/27/2018 1446   K 4.4 09/27/2018 1446   CL 105 09/27/2018 1446   CO2 19 (L) 09/27/2018 1446   GLUCOSE 71 09/27/2018 1446   BUN 12 09/27/2018 1446   CREATININE 0.79 09/27/2018 1446   CALCIUM 9.8 09/27/2018 1446   PROT 7.0 09/27/2018 1446   ALBUMIN 4.5 09/27/2018 1446   AST 20 09/27/2018 1446   ALT 20 09/27/2018 1446   ALKPHOS 86 09/27/2018 1446   BILITOT 0.2 09/27/2018 1446   GFRNONAA 97 09/27/2018 1446   GFRAA 111 09/27/2018 1446       Component Value Date/Time   WBC 8.4 09/27/2018 1446   WBC 9.8 11/08/2015 1642   RBC 4.21 09/27/2018 1446   RBC 4.31 11/08/2015 1642   HGB 13.4 09/27/2018 1446   HCT 39.8 09/27/2018 1446   PLT 250 09/27/2018 1446   MCV 95 09/27/2018 1446   MCH 31.8 09/27/2018 1446   MCH 31.1 11/08/2015 1642   MCHC 33.7 09/27/2018 1446   MCHC 33.9 11/08/2015 1642   RDW 13.4 09/27/2018 1446    No results found for: POCLITH,  LITHIUM   No results found for: PHENYTOIN, PHENOBARB, VALPROATE, CBMZ   .res Assessment: Plan:    Plan:  Continue Trintellix 10 mg qd. Continue Wellbutrin XL 300mg  qd. Cont Xanax 0.5mg   bid prn.  Return in 6 months  Patient advised to contact office with any questions, adverse effects, or acute worsening in signs and symptoms.  Discussed potential benefits, risk, and side effects of benzodiazepines to include potential risk of tolerance and dependence, as well as possible drowsiness.  Advised patient not to drive if experiencing drowsiness and to take lowest possible effective dose to minimize risk of dependence and tolerance.   Sharon Holmes was seen today for anxiety and depression.  Diagnoses and all orders for this visit:  Major depressive disorder, recurrent episode, moderate (HCC) -     buPROPion (WELLBUTRIN XL) 300 MG 24 hr tablet; Take 1 tablet (300 mg total) by mouth every morning. -     vortioxetine HBr (TRINTELLIX) 10 MG TABS tablet; Take 1 tablet (10 mg total) by mouth daily.  Generalized anxiety disorder -     ALPRAZolam (XANAX) 0.5 MG tablet; Take 1 tablet (0.5 mg total) by mouth 2 (two) times daily as needed for anxiety (take 0.5-1 tabs as needed). -     buPROPion (WELLBUTRIN XL) 300 MG 24 hr tablet; Take 1 tablet (300 mg total) by mouth every morning. -     vortioxetine HBr (TRINTELLIX) 10 MG TABS tablet; Take 1 tablet (10 mg total) by mouth daily.     Please see After Visit Summary for patient specific instructions.  Future Appointments  Date Time Provider  [place]  11/29/2018  3:30 PM Boron Waelder None  10/05/2019  3:30 PM Salvadore Dom, MD [place] None    No orders of the defined types were placed in this encounter.   -------------------------------

## 2018-11-29 ENCOUNTER — Ambulatory Visit: Payer: Self-pay

## 2018-12-02 ENCOUNTER — Other Ambulatory Visit: Payer: Self-pay

## 2018-12-02 ENCOUNTER — Ambulatory Visit (INDEPENDENT_AMBULATORY_CARE_PROVIDER_SITE_OTHER): Payer: BC Managed Care – PPO

## 2018-12-02 VITALS — BP 126/64 | HR 72 | Temp 97.5°F | Resp 14 | Ht 60.0 in | Wt 137.0 lb

## 2018-12-02 DIAGNOSIS — Z23 Encounter for immunization: Secondary | ICD-10-CM | POA: Diagnosis not present

## 2018-12-02 NOTE — Progress Notes (Signed)
Patient in today for 2nd Gardasil injection.   Contraception: abstinence LMP: 11/22/18 Last AEX: 09/27/18  with JJ  Injection given in LD. Patient tolerated shot well.   Patient informed next injection due in about 4  months.  Advised patient, if not on birth control, to return for next injection with cycle.   Routed to provider for final review.  Encounter closed.

## 2018-12-13 ENCOUNTER — Emergency Department (HOSPITAL_COMMUNITY)
Admission: EM | Admit: 2018-12-13 | Discharge: 2018-12-13 | Disposition: A | Discharge status: Home / Self Care | Payer: BC Managed Care – PPO | Attending: Emergency Medicine | Admitting: Emergency Medicine

## 2018-12-13 ENCOUNTER — Encounter (HOSPITAL_COMMUNITY): Payer: Self-pay | Admitting: Obstetrics and Gynecology

## 2018-12-13 ENCOUNTER — Emergency Department (HOSPITAL_COMMUNITY): Payer: BC Managed Care – PPO

## 2018-12-13 ENCOUNTER — Other Ambulatory Visit: Payer: Self-pay

## 2018-12-13 DIAGNOSIS — W2209XA Striking against other stationary object, initial encounter: Secondary | ICD-10-CM | POA: Diagnosis not present

## 2018-12-13 DIAGNOSIS — Z8543 Personal history of malignant neoplasm of ovary: Secondary | ICD-10-CM | POA: Diagnosis not present

## 2018-12-13 DIAGNOSIS — Y9389 Activity, other specified: Secondary | ICD-10-CM | POA: Insufficient documentation

## 2018-12-13 DIAGNOSIS — Y999 Unspecified external cause status: Secondary | ICD-10-CM | POA: Diagnosis not present

## 2018-12-13 DIAGNOSIS — S60051A Contusion of right little finger without damage to nail, initial encounter: Secondary | ICD-10-CM | POA: Insufficient documentation

## 2018-12-13 DIAGNOSIS — Y929 Unspecified place or not applicable: Secondary | ICD-10-CM | POA: Insufficient documentation

## 2018-12-13 DIAGNOSIS — S6991XA Unspecified injury of right wrist, hand and finger(s), initial encounter: Secondary | ICD-10-CM | POA: Diagnosis present

## 2018-12-13 DIAGNOSIS — S6990XA Unspecified injury of unspecified wrist, hand and finger(s), initial encounter: Secondary | ICD-10-CM

## 2018-12-13 MED ORDER — ACETAMINOPHEN 500 MG PO TABS
1000.0000 mg | ORAL_TABLET | Freq: Once | ORAL | Status: AC
  Administered 2018-12-13: 1000 mg via ORAL
  Filled 2018-12-13: qty 2

## 2018-12-13 NOTE — ED Triage Notes (Signed)
Patient reports she fell and hurt her right hand. Patient reports pain in the right pinky. Swelling noted to area.

## 2018-12-13 NOTE — ED Provider Notes (Signed)
[facility] DEPT Provider Note   CSN: [id number] Arrival date & time: 12/13/18  0216     History   Chief Complaint Chief Complaint  Patient presents with  . Fall  . Finger Injury    HPI Sharon Holmes is a 38 y.o. female.     The history is provided by the patient.  Fall This is a new problem. The current episode started 12 to 24 hours ago. The problem occurs rarely. The problem has not changed since onset.Pertinent negatives include no chest pain, no abdominal pain, no headaches and no shortness of breath. Nothing aggravates the symptoms. Nothing relieves the symptoms. Sharon Holmes has tried nothing for the symptoms. The treatment provided no relief.  Patient Injured right pinky when a light Sharon Holmes was putting in the ground snapped the light and it hit her right pinky.  Sharon Holmes has not taken anything for the pain.    Past Medical History:  Diagnosis Date  . Anxiety   . Depression   . Dizziness   . GERD (gastroesophageal reflux disease)   . Migraine    with aura around menses  . Ovarian cancer [facility]) 2008   Stage 1A    Patient Active Problem List   Diagnosis Date Noted  . Major depressive disorder, recurrent episode, moderate ([place]) 12/27/2017  . Generalized anxiety disorder 12/27/2017  . Cyst of right ovary   . Endometriosis of ovary   . Migraines 02/20/2015  . Ovarian cancer ([place]) 11/06/2011    Past Surgical History:  Procedure Laterality Date  . APPENDECTOMY  08/2006  . BUNIONECTOMY  02/2007  . CYSTECTOMY  07/2008   from right ovary  . ROBOTIC ASSISTED LAPAROSCOPIC OVARIAN CYSTECTOMY Right 06/14/2015   Procedure: ROBOTIC LAPAROSCOPIC RIGHT OVARIAN CYSTECTOMY/;  Surgeon: Everitt Amber, MD;  Location: [facility] ORS;  Service: Gynecology;  Laterality: Right;  . SALPINGOOPHORECTOMY  07/2006   Left for Stage 1A ovarian cancer  . WISDOM TOOTH EXTRACTION  2015     OB History    Gravida  0   Para  0   Term  0   Preterm  0   AB  0   Living  0     SAB   0   TAB  0   Ectopic  0   Multiple  0   Live Births               Home Medications    Prior to Admission medications   Medication Sig Start Date End Date Taking? Authorizing Provider  acetaminophen (TYLENOL) 500 MG tablet Take 500 mg by mouth every 6 (six) hours as needed.    [provider]  ALPRAZolam Duanne Moron) 0.5 MG tablet Take 1 tablet (0.5 mg total) by mouth 2 (two) times daily as needed for anxiety (take 0.5-1 tabs as needed). 11/11/18   Mozingo, Berdie Ogren, NP  buPROPion (WELLBUTRIN XL) 300 MG 24 hr tablet Take 1 tablet (300 mg total) by mouth every morning. 11/11/18   Mozingo, Berdie Ogren, NP  Cyanocobalamin (B-12) 2500 MCG SUBL Place 1 drop under the tongue daily.    [provider]  Ergocalciferol (VITAMIN D2) 50 MCG (2000 UT) TABS Take 1 tablet by mouth daily.    [provider]  FIBER SELECT GUMMIES PO Take by mouth. Takes 2 gummies a day.    [provider]  Loratadine (CLARITIN) 10 MG CAPS Take by mouth.    [provider]  Multiple Vitamins-Minerals (MULTIVITAMIN WITH MINERALS) tablet Take  1 tablet by mouth daily.    [provider]  vortioxetine HBr (TRINTELLIX) 10 MG TABS tablet Take 1 tablet (10 mg total) by mouth daily. TAKE 1 TAB DAILY 05/11/18   Donnal Moat T, PA-C  vortioxetine HBr (TRINTELLIX) 10 MG TABS tablet Take 1 tablet (10 mg total) by mouth daily. 11/11/18   Mozingo, Berdie Ogren, NP    Family History Family History  Problem Relation Age of Onset  . Heart attack Maternal Grandfather   . Heart disease Maternal Grandfather        Heart attack; CAD   . Fibromyalgia Mother   . Thyroid disease Mother   . Blindness Father   . Alcohol abuse Father   . Diabetes Maternal Aunt     Social History Social History   Tobacco Use  . Smoking status: Never Smoker  . Smokeless tobacco: Never Used  Substance Use Topics  . Alcohol use: Yes    Alcohol/week: 3.0 standard drinks    Types: 3 Standard  drinks or equivalent per week    Comment: average per week  . Drug use: No     Allergies   Amoxicillin, Aspirin, and Nsaids   Review of Systems Review of Systems  Constitutional: Negative for fever.  HENT: Negative for congestion.   Eyes: Negative for visual disturbance.  Respiratory: Negative for shortness of breath.   Cardiovascular: Negative for chest pain.  Gastrointestinal: Negative for abdominal pain.  Genitourinary: Negative for difficulty urinating.  Musculoskeletal: Negative for arthralgias.  Skin: Negative for color change.  Neurological: Negative for headaches.  Psychiatric/Behavioral: Negative for agitation.  All other systems reviewed and are negative.    Physical Exam Updated Vital Signs BP 137/86 (BP Location: Left Arm)   Pulse 86   Temp 98 F (36.7 C) (Oral)   Resp 15   LMP 11/22/2018 (Approximate)   SpO2 100%   Physical Exam Vitals signs and nursing note reviewed.  Constitutional:      General: Sharon Holmes is not in acute distress.    Appearance: Sharon Holmes is normal weight.  HENT:     Head: Normocephalic and atraumatic.     Nose: Nose normal.  Eyes:     Conjunctiva/sclera: Conjunctivae normal.     Pupils: Pupils are equal, round, and reactive to light.  Neck:     Musculoskeletal: Normal range of motion and neck supple.  Cardiovascular:     Rate and Rhythm: Normal rate and regular rhythm.     Pulses: Normal pulses.     Heart sounds: Normal heart sounds.  Pulmonary:     Effort: Pulmonary effort is normal.     Breath sounds: Normal breath sounds.  Abdominal:     General: Abdomen is flat. Bowel sounds are normal.     Tenderness: There is no abdominal tenderness. There is no guarding or rebound.  Musculoskeletal: Normal range of motion.  Skin:    General: Skin is warm and dry.     Capillary Refill: Capillary refill takes less than 2 seconds.  Neurological:     General: No focal deficit present.     Mental Status: Sharon Holmes is alert and oriented to person,  place, and time.     Deep Tendon Reflexes: Reflexes normal.  Psychiatric:        Mood and Affect: Mood normal.        Behavior: Behavior normal.      ED Treatments / Results  Labs (all labs ordered are listed, but only abnormal results are displayed) Labs  Reviewed - No data to display  EKG None  Radiology Dg Hand Complete Right  Result Date: 12/13/2018 CLINICAL DATA:  Hand pain and injury EXAM: RIGHT HAND - COMPLETE 3+ VIEW COMPARISON:  None. FINDINGS: There is no evidence of fracture or dislocation. There is no evidence of arthropathy or other focal bone abnormality. Soft tissues are unremarkable. IMPRESSION: Negative. Electronically Signed   By: Prudencio Pair M.D.   On: 12/13/2018 03:06    Procedures Procedures (including critical care time)  Medications Ordered in ED Medications  acetaminophen (TYLENOL) tablet 1,000 mg (has no administration in time range)    Finger contusion.  Ice elevation and tylenol for pain.  Have given a work note for today.   Sharon Holmes was evaluated in Emergency Department on 12/13/2018 for the symptoms described in the history of present illness. Sharon Holmes was evaluated in the context of the global COVID-19 pandemic, which necessitated consideration that the patient might be at risk for infection with the SARS-CoV-2 virus that causes COVID-19. Institutional protocols and algorithms that pertain to the evaluation of patients at risk for COVID-19 are in a state of rapid change based on information released by regulatory bodies including the CDC and federal and state organizations. These policies and algorithms were followed during the patient's care in the ED.   Final Clinical Impressions(s) / ED Diagnoses   Final diagnoses:  Finger injury, unspecified laterality, initial encounter    Return for intractable cough, coughing up blood,fevers >100.4 unrelieved by medication, shortness of breath, intractable vomiting, chest pain, shortness of breath,  weakness,numbness, changes in speech, facial asymmetry,abdominal pain, passing out,Inability to tolerate liquids or food, cough, altered mental status or any concerns. No signs of systemic illness or infection. The patient is nontoxic-appearing on exam and vital signs are within normal limits.   I have reviewed the triage vital signs and the nursing notes. Pertinent labs &imaging results that were available during my care of the patient were reviewed by me and considered in my medical decision making (see chart for details).After history, exam, and medical workup I feel the patient has beenappropriately medically screened and is safe for discharge home. Pertinent diagnoses were discussed with the patient. Patient was given return precautions.   Kynzee Devinney, MD 12/13/18 [id number]

## 2019-04-04 NOTE — Progress Notes (Signed)
Patient in today for 3rd and final Gardasil injection.   Contraception:Abstinence  LMP: 03-17-19 Last AEX: 09/27/18 with JJ  Injection given in LD per patient's request. Patient tolerated shot well.   Routed to provider for final review.  Encounter closed.

## 2019-04-07 ENCOUNTER — Other Ambulatory Visit: Payer: Self-pay

## 2019-04-07 ENCOUNTER — Ambulatory Visit (INDEPENDENT_AMBULATORY_CARE_PROVIDER_SITE_OTHER): Payer: BC Managed Care – PPO

## 2019-04-07 VITALS — BP 110/60 | Temp 97.4°F | Ht 60.0 in | Wt 139.4 lb

## 2019-04-07 DIAGNOSIS — Z23 Encounter for immunization: Secondary | ICD-10-CM

## 2019-05-12 ENCOUNTER — Encounter: Payer: Self-pay | Admitting: Physician Assistant

## 2019-05-12 ENCOUNTER — Other Ambulatory Visit: Payer: Self-pay

## 2019-05-12 ENCOUNTER — Ambulatory Visit (INDEPENDENT_AMBULATORY_CARE_PROVIDER_SITE_OTHER): Payer: BC Managed Care – PPO | Admitting: Physician Assistant

## 2019-05-12 DIAGNOSIS — F411 Generalized anxiety disorder: Secondary | ICD-10-CM | POA: Diagnosis not present

## 2019-05-12 DIAGNOSIS — F331 Major depressive disorder, recurrent, moderate: Secondary | ICD-10-CM | POA: Diagnosis not present

## 2019-05-12 MED ORDER — BUPROPION HCL ER (XL) 300 MG PO TB24: 300.0000 mg | ORAL_TABLET | Freq: Every morning | ORAL | 1 refills | Status: DC

## 2019-05-12 NOTE — Progress Notes (Signed)
[facility] Med Check  Patient ID: Anoop Amend,  MRN: [id number]  PCP: Haywood Pao, MD  Date of Evaluation: 05/12/2019 Time spent:20 minutes  Chief Complaint:  Chief Complaint    Depression; Anxiety      HISTORY/CURRENT STATUS: HPI For routine med check.   Has been more stressed and anxious too.  "I think it's situational but it's still an issue." Feels an internal 'jitteriness' a lot of the time. Had a panic attack a few weeks ago, but they're not common.   Energy and motivation are stable for her.  'At a 5/10 in general.' Not cooking now, eating garbage or ordering food. Has cried a few times at work in the past week and she never does that.  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  "It is overwhelming at times.  Things literally change from 1 day to the next."  The Wellbutrin had been working very well but now she feels overwhelmed.  She denies suicidal or homicidal thoughts.  She rarely takes the Xanax.  Does not want to need it.  But she has had more anxiety over the past few weeks and has been thinking about taking it more often.  Denies dizziness, syncope, seizures, numbness, tingling, tremor, tics, unsteady gait, slurred speech, confusion. Denies muscle or joint pain, stiffness, or dystonia.  Individual Medical History/ Review of Systems: Changes? :No    Past medications for mental health diagnoses include: Prozac, Trintellix caused severe constipation, Cymbalta, Effexor, Lexapro, Xanax  Allergies: Amoxicillin, Aspirin, Nsaids, and Trintellix [vortioxetine]  Current Medications:  Current Outpatient Medications:  .  acetaminophen (TYLENOL) 500 MG tablet, Take 500 mg by mouth every 6 (six) hours as needed., Disp: , Rfl:  .  ALPRAZolam (XANAX) 0.5 MG tablet, Take 1 tablet (0.5 mg total) by mouth 2 (two) times daily as needed for anxiety (take 0.5-1 tabs as needed).,  Disp: 30 tablet, Rfl: 2 .  buPROPion (WELLBUTRIN XL) 300 MG 24 hr tablet, Take 1 tablet (300 mg total) by mouth every morning., Disp: 90 tablet, Rfl: 1 .  Cyanocobalamin (B-12) 2500 MCG SUBL, Place 1 drop under the tongue daily., Disp: , Rfl:  .  Ergocalciferol (VITAMIN D2) 50 MCG (2000 UT) TABS, Take 1 tablet by mouth daily., Disp: , Rfl:  .  Loratadine (CLARITIN) 10 MG CAPS, Take by mouth., Disp: , Rfl:  .  Multiple Vitamins-Minerals (MULTIVITAMIN WITH MINERALS) tablet, Take 1 tablet by mouth daily., Disp: , Rfl:  .  Probiotic Product (CULTURELLE PROBIOTICS PO), Take by mouth., Disp: , Rfl:  .  FIBER SELECT GUMMIES PO, Take by mouth. Takes 2 gummies a day., Disp: , Rfl:  Medication Side Effects: constipation with Trintellix  Family Medical/ Social History: Changes? No  MENTAL HEALTH EXAM:  There were no vitals taken for this visit.There is no height or weight on file to calculate BMI.  General Appearance: Casual, Neat and Well Groomed  Eye Contact:  Good  Speech:  Clear and Coherent and Normal Rate  Volume:  Normal  Mood:  Euthymic  Affect:  Appropriate  Thought Process:  Goal Directed and Descriptions of Associations: Intact  Orientation:  Full (Time, Place, and Person)  Thought Content: Logical   Suicidal Thoughts:  No  Homicidal Thoughts:  No  Memory:  WNL  Judgement:  Good  Insight:  Good  Psychomotor Activity:  Normal  Concentration:  Concentration: Good  Recall:  Good  Fund of Knowledge: Good  Language: Good  Assets:  Desire for Improvement  ADL's:  Intact  Cognition: WNL  Prognosis:  Good    DIAGNOSES:    ICD-10-CM   1. Major depressive disorder, recurrent episode, moderate (HCC)  F33.1 buPROPion (WELLBUTRIN XL) 300 MG 24 hr tablet  2. Generalized anxiety disorder  F41.1 buPROPion (WELLBUTRIN XL) 300 MG 24 hr tablet    Receiving Psychotherapy: Yes    RECOMMENDATIONS:  PDMP reviewed. I spent 20 mins w/ her.  We discussed different options for the depression  but she prefers not to make any changes at this time.  States what she is feeling now is circumstantial and feels like things will improve.  We discussed the possibility of Lamictal.  The benefits, risk, side effects, including the possibility of Stevens-Johnson syndrome were discussed with her.  If she gets worse at any point prior to her next appointment and she would like to try the Lamictal, she can call and I will call that in.  We also discussed BuSpar.  That can help prevent anxiety.  That can also be called in if needed since we discussed the benefits risks and side effects of that as well. She is hesitant about taking the Xanax which I respect but I do recommend that she take it when needed.  That is what it is for her. Continue Wellbutrin XL 300 mg, 1 p.o. daily. Continue Xanax 0.5 mg 1 p.o. twice daily as needed. Continue therapy. Return in 3 months.  Donnal Moat, PA-C

## 2019-08-11 ENCOUNTER — Other Ambulatory Visit: Payer: Self-pay

## 2019-08-11 ENCOUNTER — Ambulatory Visit (INDEPENDENT_AMBULATORY_CARE_PROVIDER_SITE_OTHER): Payer: BC Managed Care – PPO | Admitting: Physician Assistant

## 2019-08-11 ENCOUNTER — Encounter: Payer: Self-pay | Admitting: Physician Assistant

## 2019-08-11 DIAGNOSIS — F331 Major depressive disorder, recurrent, moderate: Secondary | ICD-10-CM

## 2019-08-11 DIAGNOSIS — F411 Generalized anxiety disorder: Secondary | ICD-10-CM

## 2019-08-11 MED ORDER — BUPROPION HCL ER (XL) 300 MG PO TB24: 300.0000 mg | ORAL_TABLET | Freq: Every morning | ORAL | 1 refills | Status: DC

## 2019-08-11 NOTE — Progress Notes (Signed)
[facility]  Patient ID: Sharon Holmes,  MRN: [id number]  PCP: Haywood Pao, MD  Date of Evaluation: 08/11/2019 Time spent:20 minutes  Chief Complaint:  Chief Complaint    Follow-up      HISTORY/CURRENT STATUS: HPI For routine med check.   Doing well.  Is taking more Xanax when really anxious and it's helping.  Still doesn't take much, and usually only 1/2 pill when she does. 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  And that can be stressful at times.  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  Today was the last day of school for the kids though and she is glad for a little break.  Patient denies loss of interest in usual activities and is able to enjoy things.  Denies decreased energy or motivation.  Appetite has not changed.  No extreme sadness, tearfulness, or feelings of hopelessness.  Denies any changes in concentration, making decisions or remembering things.  Denies suicidal or homicidal thoughts.  Patient denies increased energy with decreased need for sleep, no increased talkativeness, no racing thoughts, no impulsivity or risky behaviors, no increased spending, no increased libido, no grandiosity, no increased irritability or anger, and no hallucinations.  Denies dizziness, syncope, seizures, numbness, tingling, tremor, tics, unsteady gait, slurred speech, confusion. Denies muscle or joint pain, stiffness, or dystonia.  Individual Medical History/ Review of Systems: Changes? :No    Past medications for mental health diagnoses include: Prozac, Trintellix caused severe constipation, Cymbalta, Effexor, Lexapro, Xanax  Allergies: Amoxicillin, Aspirin, Nsaids, and Trintellix [vortioxetine]  Current Medications:  Current Outpatient Medications:  .  acetaminophen (TYLENOL) 500 MG tablet, Take 500 mg by mouth every 6 (six) hours as needed., Disp: , Rfl:  .  ALPRAZolam (XANAX) 0.5 MG tablet, Take 1 tablet  (0.5 mg total) by mouth 2 (two) times daily as needed for anxiety (take 0.5-1 tabs as needed)., Disp: 30 tablet, Rfl: 2 .  buPROPion (WELLBUTRIN XL) 300 MG 24 hr tablet, Take 1 tablet (300 mg total) by mouth every morning., Disp: 90 tablet, Rfl: 1 .  Cyanocobalamin (B-12) 2500 MCG SUBL, Place 1 drop under the tongue daily., Disp: , Rfl:  .  Ergocalciferol (VITAMIN D2) 50 MCG (2000 UT) TABS, Take 1 tablet by mouth daily., Disp: , Rfl:  .  fluticasone (FLONASE) 50 MCG/ACT nasal spray, Place into both nostrils daily., Disp: , Rfl:  .  Loratadine (CLARITIN) 10 MG CAPS, Take by mouth., Disp: , Rfl:  .  Multiple Vitamins-Minerals (MULTIVITAMIN WITH MINERALS) tablet, Take 1 tablet by mouth daily., Disp: , Rfl:  .  Probiotic Product (CULTURELLE PROBIOTICS PO), Take by mouth., Disp: , Rfl:  Medication Side Effects: none   Family Medical/ Social History: Changes? No  MENTAL HEALTH EXAM:  There were no vitals taken for this visit.There is no height or weight on file to calculate BMI.  General Appearance: Casual, Neat and Well Groomed  Eye Contact:  Good  Speech:  Clear and Coherent and Normal Rate  Volume:  Normal  Mood:  Euthymic  Affect:  Appropriate  Thought Process:  Goal Directed and Descriptions of Associations: Intact  Orientation:  Full (Time, Place, and Person)  Thought Content: Logical   Suicidal Thoughts:  No  Homicidal Thoughts:  No  Memory:  WNL  Judgement:  Good  Insight:  Good  Psychomotor Activity:  Normal  Concentration:  Concentration: Good  Recall:  Good  Fund of Knowledge: Good  Language: Good  Assets:  Desire for Improvement  ADL's:  Intact  Cognition: WNL  Prognosis:  Good    DIAGNOSES:    ICD-10-CM   1. Major depressive disorder, recurrent episode, moderate (HCC)  F33.1 buPROPion (WELLBUTRIN XL) 300 MG 24 hr tablet  2. Generalized anxiety disorder  F41.1 buPROPion (WELLBUTRIN XL) 300 MG 24 hr tablet    Receiving Psychotherapy: Yes    RECOMMENDATIONS:   PDMP reviewed. I spent 20 mins w/ her.  Continue Wellbutrin XL 300 mg, 1 p.o. every morning Continue Xanax 0.5 mg 1 p.o. twice daily as needed. Continue therapy. Return in 6 months.  Donnal Moat, PA-C

## 2019-10-03 NOTE — Progress Notes (Signed)
39 y.o. [name] Single White or Caucasian Not Hispanic or Latino female here for annual exam.   She has a h/o a LSO for stage 1A ovarian cancer in 2008. In 2017 she had a right ovarian cystectomy for a cystadenoma, also noted to have endometriosis.  She is moving back to [place], will be close to her family. Period Cycle (Days): 28 Period Duration (Days): 3-4 Period Pattern: Regular Menstrual Flow: Light, Moderate Menstrual Control: Tampon Menstrual Control Change Freq (Hours): 3-4 Dysmenorrhea: None  Patient's last menstrual period was 09/20/2019 (within days).          Sexually active: No.  The current method of family planning is abstinence.    Exercising: Yes.    gym 3-4 days a week Smoker:  no  Health Maintenance: Pap:  03/30/2017 WNL NEG HPV, 10-21-12 WNL NEG HR HPV History of abnormal Pap:  no MMG:  None  BMD:   None  Colonoscopy: none  TDaP:  2012 Gardasil: Complete    reports that she has never smoked. She has never used smokeless tobacco. She reports current alcohol use of about 3.0 - 4.0 standard drinks of alcohol per week. She reports that she does not use drugs. She is an Chiropodist.  Past Medical History:  Diagnosis Date   Anxiety    Depression    Dizziness    GERD (gastroesophageal reflux disease)    Migraine    with aura around menses   Ovarian cancer ([place]) 2008   Stage 1A    Past Surgical History:  Procedure Laterality Date   APPENDECTOMY  08/2006   BUNIONECTOMY  02/2007   CYSTECTOMY  07/2008   from right ovary   ROBOTIC ASSISTED LAPAROSCOPIC OVARIAN CYSTECTOMY Right 06/14/2015   Procedure: ROBOTIC LAPAROSCOPIC RIGHT OVARIAN CYSTECTOMY/;  Surgeon: Everitt Amber, MD;  Location: [facility];  Service: Gynecology;  Laterality: Right;   SALPINGOOPHORECTOMY  07/2006   Left for Stage 1A ovarian cancer   WISDOM TOOTH EXTRACTION  2015    Current Outpatient Medications  Medication Sig Dispense Refill   acetaminophen (TYLENOL) 500 MG tablet  Take 500 mg by mouth every 6 (six) hours as needed.     ALPRAZolam (XANAX) 0.5 MG tablet Take 1 tablet (0.5 mg total) by mouth 2 (two) times daily as needed for anxiety (take 0.5-1 tabs as needed). 30 tablet 2   buPROPion (WELLBUTRIN XL) 300 MG 24 hr tablet Take 1 tablet (300 mg total) by mouth every morning. 90 tablet 1   Cyanocobalamin (B-12) 2500 MCG SUBL Place 1 drop under the tongue daily.     Ergocalciferol (VITAMIN D2) 50 MCG (2000 UT) TABS Take 1 tablet by mouth daily.     fluticasone (FLONASE) 50 MCG/ACT nasal spray Place into both nostrils daily.     Multiple Vitamins-Minerals (MULTIVITAMIN WITH MINERALS) tablet Take 1 tablet by mouth daily.     Probiotic Product (CULTURELLE PROBIOTICS PO) Take by mouth.     No current facility-administered medications for this visit.    Family History  Problem Relation Age of Onset   Heart attack Maternal Grandfather    Heart disease Maternal Grandfather        Heart attack; CAD    Fibromyalgia Mother    Thyroid disease Mother    Blindness Father    Alcohol abuse Father    Diabetes Maternal Aunt     Review of Systems  Constitutional: Negative.   HENT: Negative.   Eyes: Negative.   Respiratory: Negative.  Cardiovascular: Negative.   Gastrointestinal: Negative.   Endocrine: Negative.   Genitourinary: Negative.   Musculoskeletal: Negative.   Skin: Negative.   Allergic/Immunologic: Negative.   Neurological: Negative.   Hematological: Negative.   Psychiatric/Behavioral: Negative.     Exam:   BP 120/68 (BP Location: Right Arm, Patient Position: Sitting, Cuff Size: Normal)    Pulse 92    Ht 5' (1.524 m)    Wt 130 lb (59 kg)    LMP 09/20/2019 (Within Days)    SpO2 98%    BMI 25.39 kg/m   Weight change: @WEIGHTCHANGE @ Height:   Height: 5' (152.4 cm)  Ht Readings from Last 3 Encounters:  10/05/19 5' (1.524 m)  04/07/19 5' (1.524 m)  12/02/18 5' (1.524 m)    General appearance: alert, cooperative and appears stated  age Head: Normocephalic, without obvious abnormality, atraumatic Neck: no adenopathy, supple, symmetrical, trachea midline and thyroid normal to inspection and palpation Lungs: clear to auscultation bilaterally Cardiovascular: regular rate and rhythm Breasts: normal appearance, no masses or tenderness Abdomen: soft, non-tender; non distended,  no masses,  no organomegaly Extremities: extremities normal, atraumatic, no cyanosis or edema Skin: Skin color, texture, turgor normal. No rashes or lesions Lymph nodes: Cervical, supraclavicular, and axillary nodes normal. No abnormal inguinal nodes palpated Neurologic: Grossly normal   Pelvic: External genitalia:  no lesions              Urethra:  normal appearing urethra with no masses, tenderness or lesions              Bartholins and Skenes: normal                 Vagina: normal appearing vagina with normal color and discharge, no lesions              Cervix: large cervical polyp, removed with ringed forceps               Bimanual Exam:  Uterus:  normal size, contour, position, consistency, mobility, non-tender              Adnexa: no mass, fullness, tenderness               Rectovaginal: Confirms               Anus:  normal sphincter tone, no lesions  Terence Lux chaperoned for the exam.  A:  Well Woman with normal exam  H/O ovarian cancer  H/O endometriosis  H/O elevated CA 125  Cervical polyp  P:   No pap this year  CA 125  Screening labs with primary just done  Discussed breast self exam  Discussed calcium and vit D intake  Cervical polyp removed.

## 2019-10-05 ENCOUNTER — Other Ambulatory Visit: Payer: Self-pay

## 2019-10-05 ENCOUNTER — Encounter: Payer: Self-pay | Admitting: Obstetrics and Gynecology

## 2019-10-05 ENCOUNTER — Ambulatory Visit: Payer: BC Managed Care – PPO | Admitting: Obstetrics and Gynecology

## 2019-10-05 VITALS — BP 120/68 | HR 92 | Ht 60.0 in | Wt 130.0 lb

## 2019-10-05 DIAGNOSIS — Z01419 Encounter for gynecological examination (general) (routine) without abnormal findings: Secondary | ICD-10-CM

## 2019-10-05 DIAGNOSIS — N841 Polyp of cervix uteri: Secondary | ICD-10-CM

## 2019-10-05 DIAGNOSIS — Z8543 Personal history of malignant neoplasm of ovary: Secondary | ICD-10-CM | POA: Diagnosis not present

## 2019-10-05 DIAGNOSIS — R971 Elevated cancer antigen 125 [CA 125]: Secondary | ICD-10-CM

## 2019-10-05 DIAGNOSIS — Z8742 Personal history of other diseases of the female genital tract: Secondary | ICD-10-CM

## 2019-10-05 NOTE — Patient Instructions (Signed)
EXERCISE AND DIET:  We recommended that you start or continue a regular exercise program for good health. Regular exercise means any activity that makes your heart beat faster and makes you sweat.  We recommend exercising at least 30 minutes per day at least 3 days a week, preferably 4 or 5.  We also recommend a diet low in fat and sugar.  Inactivity, poor dietary choices and obesity can cause diabetes, heart attack, stroke, and kidney damage, among others.    ALCOHOL AND SMOKING:  Women should limit their alcohol intake to no more than 7 drinks/beers/glasses of wine (combined, not each!) per week. Moderation of alcohol intake to this level decreases your risk of breast cancer and liver damage. And of course, no recreational drugs are part of a healthy lifestyle.  And absolutely no smoking or even second hand smoke. Most people know smoking can cause heart and lung diseases, but did you know it also contributes to weakening of your bones? Aging of your skin?  Yellowing of your teeth and nails?  CALCIUM AND VITAMIN D:  Adequate intake of calcium and Vitamin D are recommended.  The recommendations for exact amounts of these supplements seem to change often, but generally speaking 1,000 mg of calcium (between diet and supplement) and 800 units of Vitamin D per day seems prudent. Certain women may benefit from higher intake of Vitamin D.  If you are among these women, your doctor will have told you during your visit.    PAP SMEARS:  Pap smears, to check for cervical cancer or precancers,  have traditionally been done yearly, although recent scientific advances have shown that most women can have pap smears less often.  However, every woman still should have a physical exam from her gynecologist every year. It will include a breast check, inspection of the vulva and vagina to check for abnormal growths or skin changes, a visual exam of the cervix, and then an exam to evaluate the size and shape of the uterus and  ovaries.  And after 37 years of age, a rectal exam is indicated to check for rectal cancers. We will also provide age appropriate advice regarding health maintenance, like when you should have certain vaccines, screening for sexually transmitted diseases, bone density testing, colonoscopy, mammograms, etc.   MAMMOGRAMS:  All women over 40 years old should have a yearly mammogram. Many facilities now offer a "3D" mammogram, which may cost around $50 extra out of pocket. If possible,  we recommend you accept the option to have the 3D mammogram performed.  It both reduces the number of women who will be called back for extra views which then turn out to be normal, and it is better than the routine mammogram at detecting truly abnormal areas.    COLON CANCER SCREENING: Now recommend starting at age 45. At this time colonoscopy is not covered for routine screening until 50. There are take home tests that can be done between 45-49.   COLONOSCOPY:  Colonoscopy to screen for colon cancer is recommended for all women at age 50.  We know, you hate the idea of the prep.  We agree, BUT, having colon cancer and not knowing it is worse!!  Colon cancer so often starts as a polyp that can be seen and removed at colonscopy, which can quite literally save your life!  And if your first colonoscopy is normal and you have no family history of colon cancer, most women don't have to have it again for   10 years.  Once every ten years, you can do something that may end up saving your life, right?  We will be happy to help you get it scheduled when you are ready.  Be sure to check your insurance coverage so you understand how much it will cost.  It may be covered as a preventative service at no cost, but you should check your particular policy.      Breast Self-Awareness Breast self-awareness means being familiar with how your breasts look and feel. It involves checking your breasts regularly and reporting any changes to your  health care provider. Practicing breast self-awareness is important. A change in your breasts can be a sign of a serious medical problem. Being familiar with how your breasts look and feel allows you to find any problems early, when treatment is more likely to be successful. All women should practice breast self-awareness, including women who have had breast implants. How to do a breast self-exam One way to learn what is normal for your breasts and whether your breasts are changing is to do a breast self-exam. To do a breast self-exam: Look for Changes  1. Remove all the clothing above your waist. 2. Stand in front of a mirror in a room with good lighting. 3. Put your hands on your hips. 4. Push your hands firmly downward. 5. Compare your breasts in the mirror. Look for differences between them (asymmetry), such as: ? Differences in shape. ? Differences in size. ? Puckers, dips, and bumps in one breast and not the other. 6. Look at each breast for changes in your skin, such as: ? Redness. ? Scaly areas. 7. Look for changes in your nipples, such as: ? Discharge. ? Bleeding. ? Dimpling. ? Redness. ? A change in position. Feel for Changes Carefully feel your breasts for lumps and changes. It is best to do this while lying on your back on the floor and again while sitting or standing in the shower or tub with soapy water on your skin. Feel each breast in the following way:  Place the arm on the side of the breast you are examining above your head.  Feel your breast with the other hand.  Start in the nipple area and make  inch (2 cm) overlapping circles to feel your breast. Use the pads of your three middle fingers to do this. Apply light pressure, then medium pressure, then firm pressure. The light pressure will allow you to feel the tissue closest to the skin. The medium pressure will allow you to feel the tissue that is a little deeper. The firm pressure will allow you to feel the tissue  close to the ribs.  Continue the overlapping circles, moving downward over the breast until you feel your ribs below your breast.  Move one finger-width toward the center of the body. Continue to use the  inch (2 cm) overlapping circles to feel your breast as you move slowly up toward your collarbone.  Continue the up and down exam using all three pressures until you reach your armpit.  Write Down What You Find  Write down what is normal for each breast and any changes that you find. Keep a written record with breast changes or normal findings for each breast. By writing this information down, you do not need to depend only on memory for size, tenderness, or location. Write down where you are in your menstrual cycle, if you are still menstruating. If you are having trouble noticing differences   in your breasts, do not get discouraged. With time you will become more familiar with the variations in your breasts and more comfortable with the exam. How often should I examine my breasts? Examine your breasts every month. If you are breastfeeding, the best time to examine your breasts is after a feeding or after using a breast pump. If you menstruate, the best time to examine your breasts is 5-7 days after your period is over. During your period, your breasts are lumpier, and it may be more difficult to notice changes. When should I see my health care provider? See your health care provider if you notice:  A change in shape or size of your breasts or nipples.  A change in the skin of your breast or nipples, such as a reddened or scaly area.  Unusual discharge from your nipples.  A lump or thick area that was not there before.  Pain in your breasts.  Anything that concerns you.  

## 2019-10-06 LAB — CA 125: Cancer Antigen (CA) 125: 61.3 U/mL — ABNORMAL HIGH (ref 0.0–38.1)

## 2019-10-07 ENCOUNTER — Other Ambulatory Visit (HOSPITAL_COMMUNITY)
Admission: RE | Admit: 2019-10-07 | Discharge: 2019-10-07 | Disposition: A | Discharge status: Home / Self Care | Payer: BC Managed Care – PPO | Source: Ambulatory Visit | Attending: Obstetrics and Gynecology | Admitting: Obstetrics and Gynecology

## 2019-10-07 DIAGNOSIS — N841 Polyp of cervix uteri: Secondary | ICD-10-CM | POA: Diagnosis not present

## 2019-10-07 NOTE — Addendum Note (Signed)
Addended by: Dorothy Spark on: 10/07/2019 11:21 AM   Modules accepted: Orders

## 2019-10-10 LAB — SURGICAL PATHOLOGY

## 2020-02-15 ENCOUNTER — Ambulatory Visit: Payer: BC Managed Care – PPO | Admitting: Physician Assistant

## 2020-03-12 ENCOUNTER — Other Ambulatory Visit: Payer: Self-pay | Admitting: Physician Assistant

## 2020-03-12 DIAGNOSIS — F331 Major depressive disorder, recurrent, moderate: Secondary | ICD-10-CM

## 2020-03-12 DIAGNOSIS — F411 Generalized anxiety disorder: Secondary | ICD-10-CM

## 2020-03-12 MED ORDER — BUPROPION HCL ER (XL) 300 MG PO TB24: 300.0000 mg | ORAL_TABLET | Freq: Every day | ORAL | 1 refills | Status: DC

## 2020-05-16 ENCOUNTER — Other Ambulatory Visit: Payer: Self-pay | Admitting: Physician Assistant

## 2020-05-16 DIAGNOSIS — F331 Major depressive disorder, recurrent, moderate: Secondary | ICD-10-CM

## 2020-05-16 DIAGNOSIS — F411 Generalized anxiety disorder: Secondary | ICD-10-CM

## 2021-02-21 IMAGING — DX DG HAND COMPLETE 3+V*R*
3 series · 3 of 3 positions shown · non-contrast
Comparison: None.

CLINICAL DATA: Hand pain and injury

EXAM:
RIGHT HAND - COMPLETE 3+ VIEW

[hand ap]
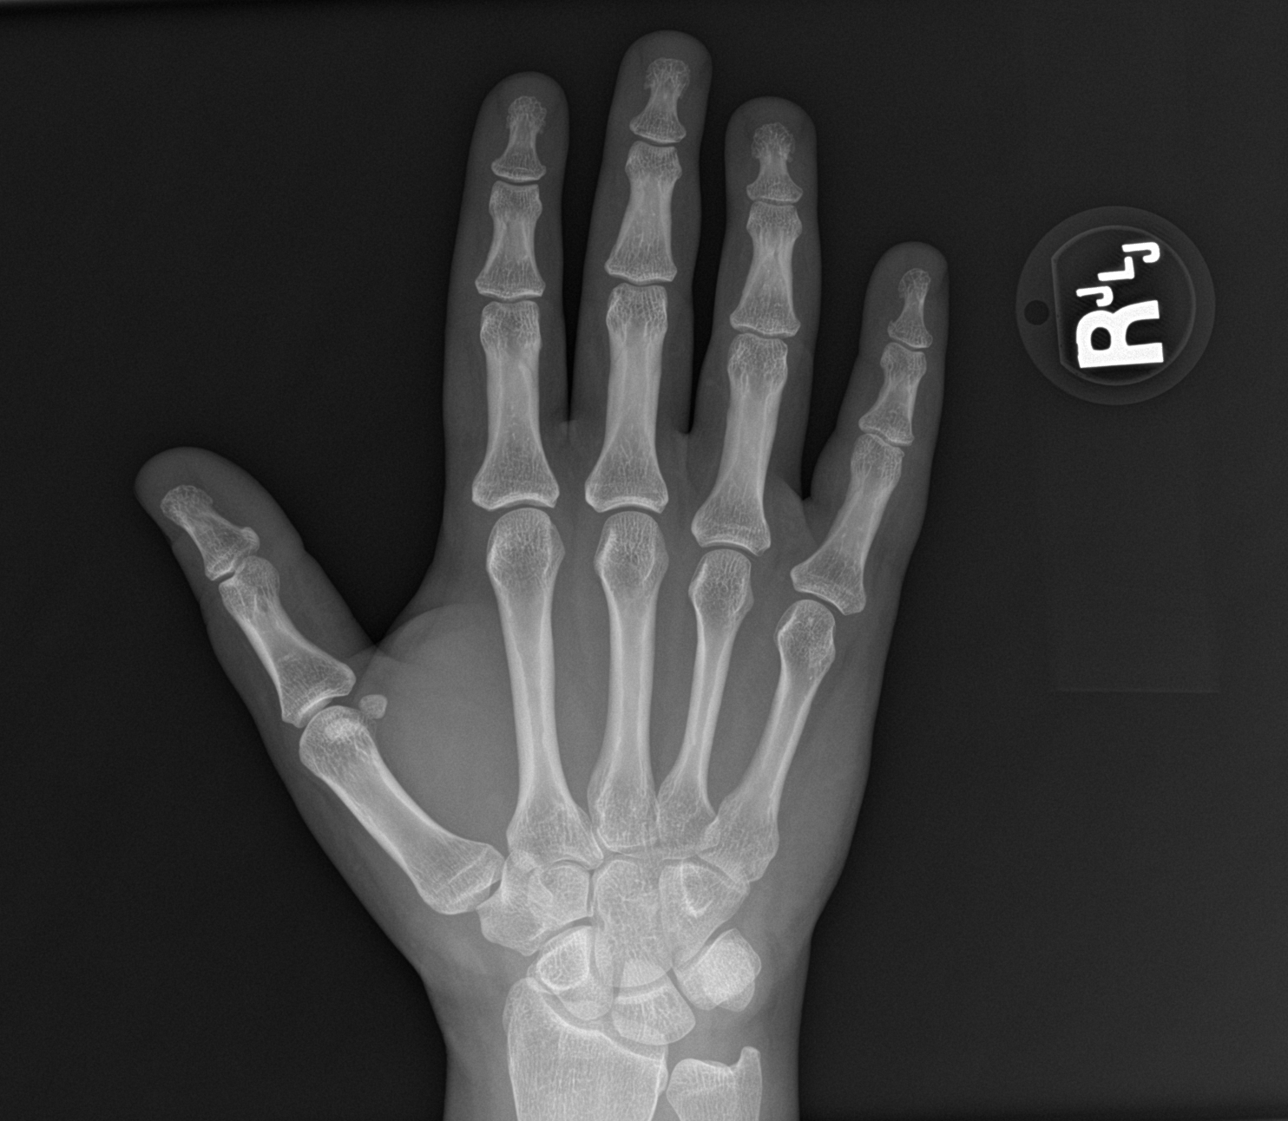

[hand obl]
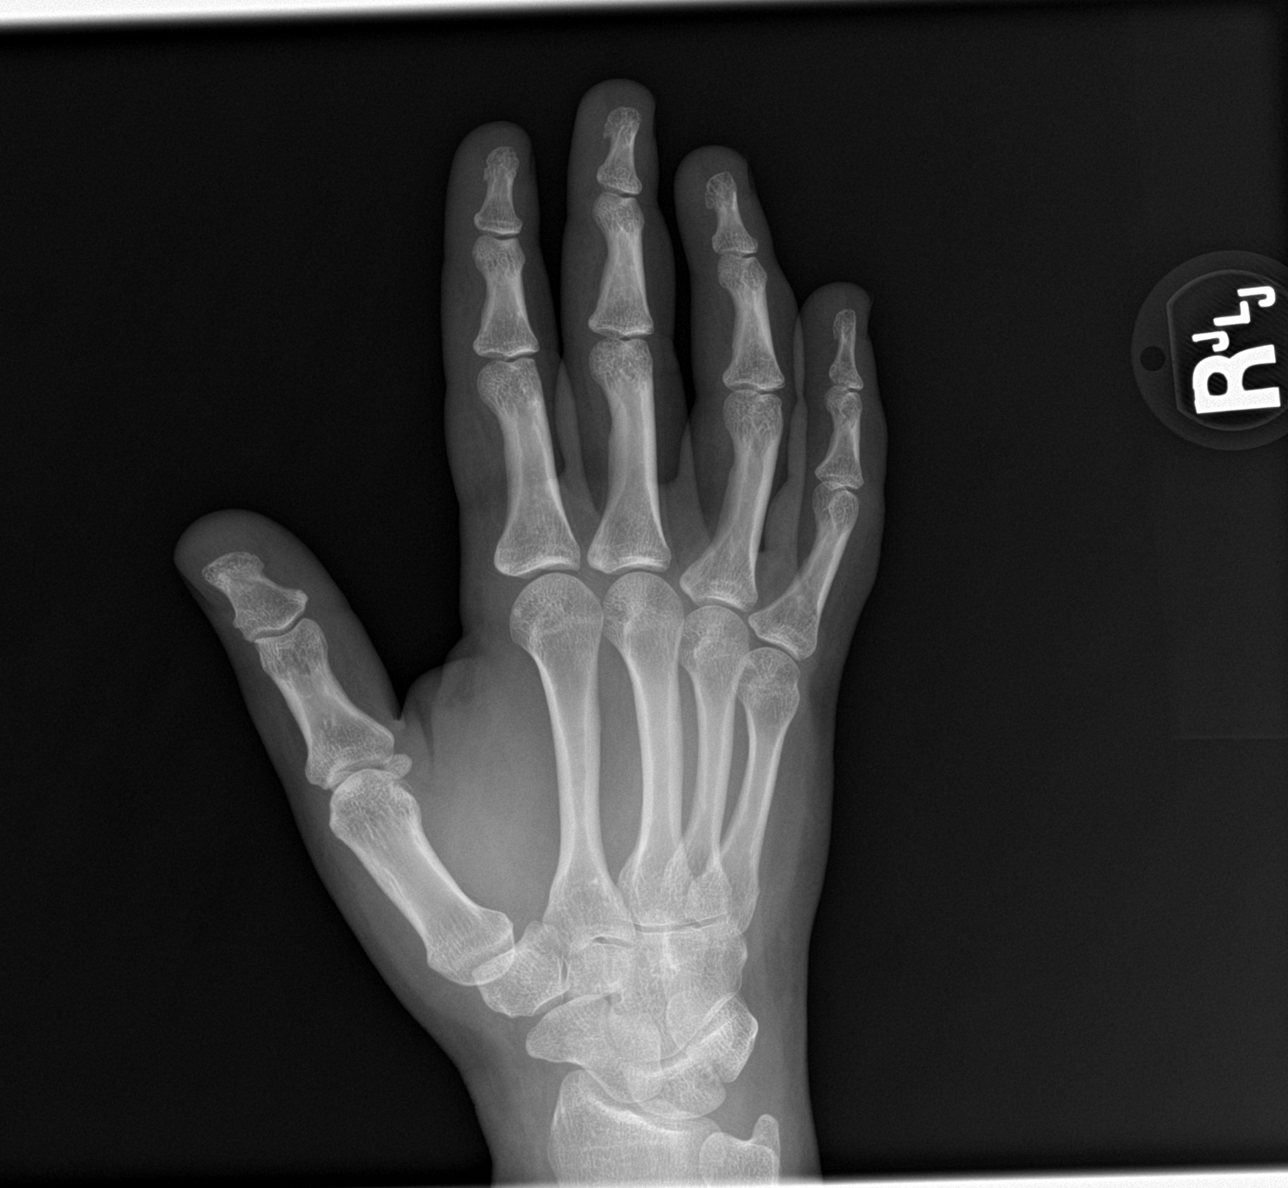

[hand lat]
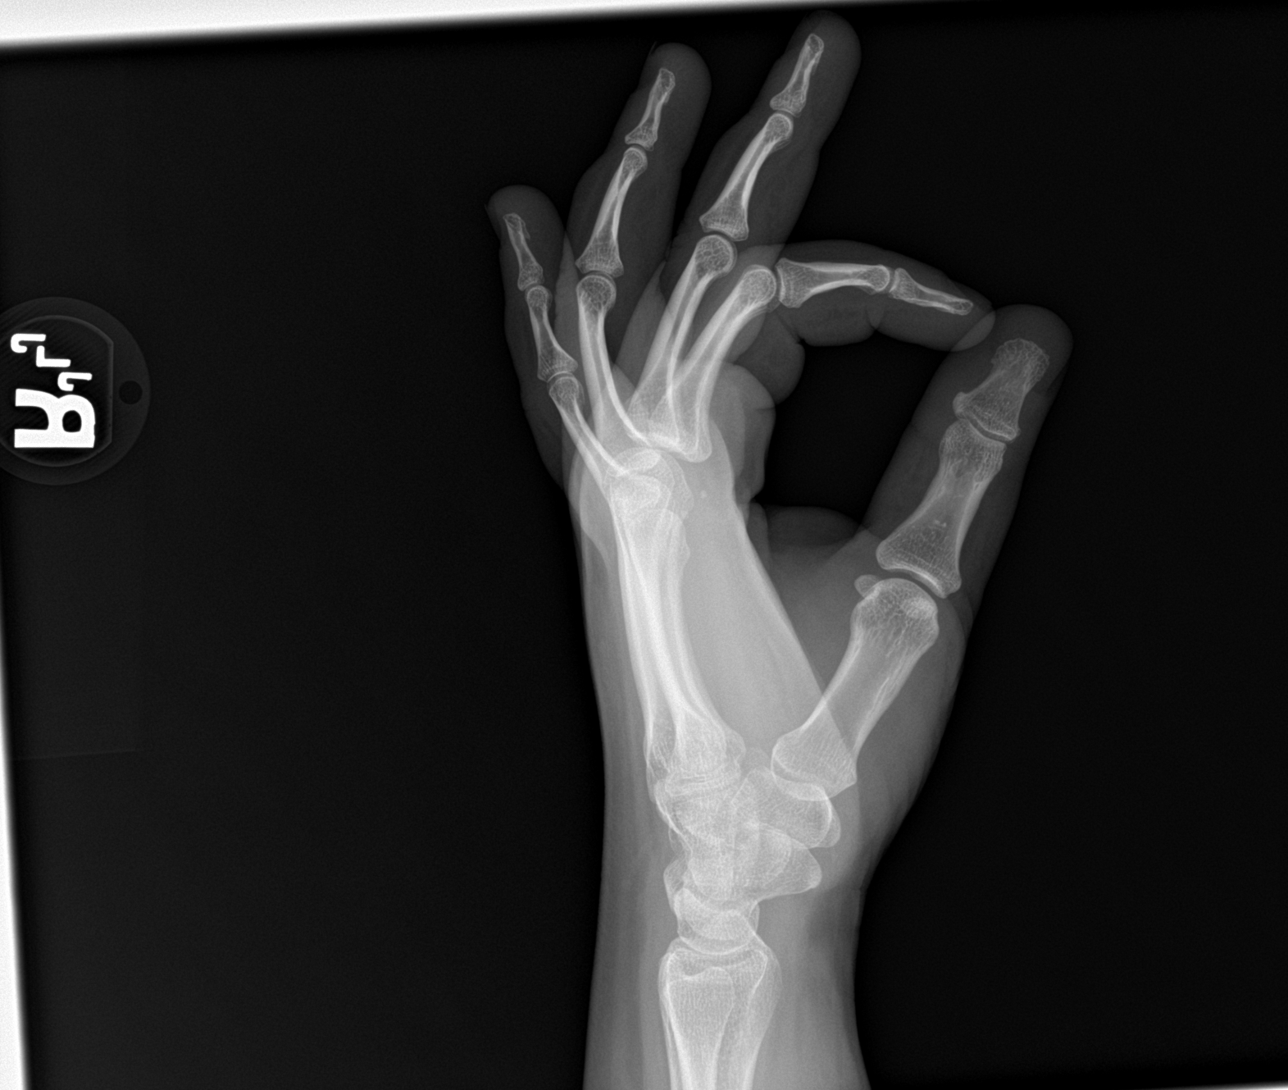

[3 of 3 positions shown; findings below may reference images not displayed]

FINDINGS: There is no evidence of fracture or dislocation. There is no
evidence of arthropathy or other focal bone abnormality. Soft
tissues are unremarkable.
IMPRESSION: Negative.

## 2022-02-17 NOTE — Progress Notes (Signed)
41 y.o. [name] Single White or Caucasian Not Hispanic or Latino female here for annual exam.  She has a h/o a LSO for stage 1A ovarian cancer in 2008. In 2017 she had a right ovarian cystectomy for a cystadenoma, also noted to have endometriosis.  The GYN Oncologist recommended yearly CA 125. Period Cycle (Days): 28 Period Duration (Days): 5 Period Pattern: Regular Menstrual Flow: Moderate Menstrual Control: Maxi pad, Tampon Dysmenorrhea: (!) Mild Dysmenorrhea Symptoms: Cramping  Patient's last menstrual period was 02/14/2022.          Sexually active: No. Never sexually active.  The current method of family planning is abstinence.    Exercising: Yes.    walking Smoker:  no  Health Maintenance: Pap:   03/30/2017 WNL NEG HPV, 10-21-12 WNL NEG HR HPV  History of abnormal Pap:  no MMG:  None  BMD:   None  Colonoscopy: none  TDaP:  2012, thinks she had it, she will check her records.  Gardasil: Complete    reports that she has never smoked. She has never used smokeless tobacco. She reports current alcohol use of about 3.0 - 4.0 standard drinks of alcohol per week. She reports that she does not use drugs. She is a Primary school teacher.   Past Medical History:  Diagnosis Date   Anxiety    Depression    Dizziness    GERD (gastroesophageal reflux disease)    Migraine    with aura around menses   Ovarian cancer ([place]) 2008   Stage 1A    Past Surgical History:  Procedure Laterality Date   APPENDECTOMY  08/2006   BUNIONECTOMY  02/2007   CYSTECTOMY  07/2008   from right ovary   ROBOTIC ASSISTED LAPAROSCOPIC OVARIAN CYSTECTOMY Right 06/14/2015   Procedure: ROBOTIC LAPAROSCOPIC RIGHT OVARIAN CYSTECTOMY/;  Surgeon: Everitt Amber, MD;  Location: [facility];  Service: Gynecology;  Laterality: Right;   SALPINGOOPHORECTOMY  07/2006   Left for Stage 1A ovarian cancer   WISDOM TOOTH EXTRACTION  2015    Current Outpatient Medications  Medication Sig Dispense Refill   acetaminophen (TYLENOL) 500  MG tablet Take 500 mg by mouth every 6 (six) hours as needed.     ALPRAZolam (XANAX) 0.5 MG tablet Take 1 tablet (0.5 mg total) by mouth 2 (two) times daily as needed for anxiety (take 0.5-1 tabs as needed). 30 tablet 2   buPROPion (WELLBUTRIN XL) 300 MG 24 hr tablet Take 1 tablet (300 mg total) by mouth daily. 30 tablet 1   Cyanocobalamin (B-12) 2500 MCG SUBL Place 1 drop under the tongue daily.     Ergocalciferol (VITAMIN D2) 50 MCG (2000 UT) TABS Take 1 tablet by mouth daily.     fluticasone (FLONASE) 50 MCG/ACT nasal spray Place into both nostrils daily.     Multiple Vitamins-Minerals (MULTIVITAMIN WITH MINERALS) tablet Take 1 tablet by mouth daily.     No current facility-administered medications for this visit.    Family History  Problem Relation Age of Onset   Heart attack Maternal Grandfather    Heart disease Maternal Grandfather        Heart attack; CAD    Fibromyalgia Mother    Thyroid disease Mother    Blindness Father    Alcohol abuse Father    Diabetes Maternal Aunt     Review of Systems  All other systems reviewed and are negative.   Exam:   BP 110/78 (BP Location: Right Arm, Patient Position: Sitting, Cuff Size: Normal)  Pulse 80   Ht 5' (1.524 m)   Wt 125 lb (56.7 kg)   LMP 02/14/2022   BMI 24.41 kg/m   Weight change: _0 @ Height:   Height: 5' (152.4 cm)  Ht Readings from Last 3 Encounters:  02/26/22 5' (1.524 m)  10/05/19 5' (1.524 m)  04/07/19 5' (1.524 m)    General appearance: alert, cooperative and appears stated age Head: Normocephalic, without obvious abnormality, atraumatic Neck: no adenopathy, supple, symmetrical, trachea midline and thyroid normal to inspection and palpation Lungs: clear to auscultation bilaterally Cardiovascular: regular rate and rhythm Breasts: normal appearance, no masses or tenderness Abdomen: soft, non-tender; non distended,  no masses,  no organomegaly Extremities: extremities normal, atraumatic, no cyanosis  or edema Skin: Skin color, texture, turgor normal. No rashes or lesions Lymph nodes: Cervical, supraclavicular, and axillary nodes normal. No abnormal inguinal nodes palpated Neurologic: Grossly normal   Pelvic: External genitalia:  no lesions              Urethra:  normal appearing urethra with no masses, tenderness or lesions              Bartholins and Skenes: normal                 Vagina: normal appearing vagina with normal color and discharge, no lesions              Cervix: no lesions and slightly friable with pap               Bimanual Exam:  Uterus:   no masses or tenderness              Adnexa: no mass, fullness, tenderness               Rectovaginal: Confirms               Anus:  normal sphincter tone, no lesions  Gae Dry, CMA chaperoned for the exam.  1. Well woman exam Discussed breast self exam Discussed calcium and vit D intake Mammogram after she turns 42 She will check on her TDAP immunization status  2. History of ovarian cancer - CA 125 - Ambulatory referral to Genetics, she has never had BRCA testing  3. History of endometriosis  4. Screening for cervical cancer - Cytology - PAP  5. Vitamin D deficiency On daily vit d - VITAMIN D 25 Hydroxy (Vit-D Deficiency, Fractures)  6. Lipids abnormal - Lipid panel  7. Laboratory exam ordered as part of routine general medical examination - CBC - Comprehensive metabolic panel

## 2022-02-26 ENCOUNTER — Ambulatory Visit (INDEPENDENT_AMBULATORY_CARE_PROVIDER_SITE_OTHER): Payer: BC Managed Care – PPO | Admitting: Obstetrics and Gynecology

## 2022-02-26 ENCOUNTER — Other Ambulatory Visit (HOSPITAL_COMMUNITY)
Admission: RE | Admit: 2022-02-26 | Discharge: 2022-02-26 | Disposition: A | Discharge status: Home / Self Care | Payer: BC Managed Care – PPO | Source: Ambulatory Visit | Attending: Obstetrics and Gynecology | Admitting: Obstetrics and Gynecology

## 2022-02-26 ENCOUNTER — Encounter: Payer: Self-pay | Admitting: Obstetrics and Gynecology

## 2022-02-26 VITALS — BP 110/78 | HR 80 | Ht 60.0 in | Wt 125.0 lb

## 2022-02-26 DIAGNOSIS — E559 Vitamin D deficiency, unspecified: Secondary | ICD-10-CM | POA: Diagnosis not present

## 2022-02-26 DIAGNOSIS — Z124 Encounter for screening for malignant neoplasm of cervix: Secondary | ICD-10-CM | POA: Diagnosis not present

## 2022-02-26 DIAGNOSIS — Z0000 Encounter for general adult medical examination without abnormal findings: Secondary | ICD-10-CM

## 2022-02-26 DIAGNOSIS — Z01419 Encounter for gynecological examination (general) (routine) without abnormal findings: Secondary | ICD-10-CM

## 2022-02-26 DIAGNOSIS — Z8742 Personal history of other diseases of the female genital tract: Secondary | ICD-10-CM

## 2022-02-26 DIAGNOSIS — Z8543 Personal history of malignant neoplasm of ovary: Secondary | ICD-10-CM | POA: Diagnosis not present

## 2022-02-26 DIAGNOSIS — E7889 Other lipoprotein metabolism disorders: Secondary | ICD-10-CM

## 2022-02-26 NOTE — Patient Instructions (Signed)

## 2022-02-27 LAB — LIPID PANEL
Cholesterol: 210 mg/dL — ABNORMAL HIGH (ref <200)
HDL: 61 mg/dL (ref >=50)
LDL Cholesterol (Calc): 125 mg/dL (calc) — ABNORMAL HIGH
Non-HDL Cholesterol (Calc): 149 mg/dL (calc) — ABNORMAL HIGH (ref <130)
Total CHOL/HDL Ratio: 3.4 (calc) (ref <5.0)
Triglycerides: 129 mg/dL (ref <150)

## 2022-02-27 LAB — COMPREHENSIVE METABOLIC PANEL
AG Ratio: 1.8 (calc) (ref 1.0–2.5)
ALT: 15 U/L (ref 6–29)
AST: 16 U/L (ref 10–30)
Albumin: 4.5 g/dL (ref 3.6–5.1)
Alkaline phosphatase (APISO): 74 U/L (ref 31–125)
BUN: 13 mg/dL (ref 7–25)
CO2: 24 mmol/L (ref 20–32)
Calcium: 10.2 mg/dL (ref 8.6–10.2)
Chloride: 106 mmol/L (ref 98–110)
Creat: 0.73 mg/dL (ref 0.50–0.97)
Globulin: 2.5 g/dL (calc) (ref 1.9–3.7)
Glucose, Bld: 87 mg/dL (ref 65–99)
Potassium: 4 mmol/L (ref 3.5–5.3)
Sodium: 140 mmol/L (ref 135–146)
Total Bilirubin: 0.3 mg/dL (ref 0.2–1.2)
Total Protein: 7 g/dL (ref 6.1–8.1)

## 2022-02-27 LAB — CBC
HCT: 38.4 % (ref 35.0–45.0)
Hemoglobin: 13.5 g/dL (ref 11.7–15.5)
MCH: 32.1 pg (ref 27.0–33.0)
MCHC: 35.2 g/dL (ref 32.0–36.0)
MCV: 91.2 fL (ref 80.0–100.0)
MPV: 12 fL (ref 7.5–12.5)
Platelets: 305 10*3/uL (ref 140–400)
RBC: 4.21 10*6/uL (ref 3.80–5.10)
RDW: 13 % (ref 11.0–15.0)
WBC: 12.1 10*3/uL — ABNORMAL HIGH (ref 3.8–10.8)

## 2022-02-27 LAB — CA 125: CA 125: 57 U/mL — ABNORMAL HIGH (ref <35)

## 2022-02-27 LAB — VITAMIN D 25 HYDROXY (VIT D DEFICIENCY, FRACTURES): Vit D, 25-Hydroxy: 37 ng/mL (ref 30–100)

## 2022-03-04 LAB — CYTOLOGY - PAP
Comment: NEGATIVE
Diagnosis: NEGATIVE
High risk HPV: NEGATIVE

## 2022-03-31 ENCOUNTER — Encounter: Payer: Self-pay | Admitting: Physician Assistant

## 2022-03-31 ENCOUNTER — Ambulatory Visit (INDEPENDENT_AMBULATORY_CARE_PROVIDER_SITE_OTHER): Payer: BC Managed Care – PPO | Admitting: Physician Assistant

## 2022-03-31 DIAGNOSIS — F331 Major depressive disorder, recurrent, moderate: Secondary | ICD-10-CM

## 2022-03-31 DIAGNOSIS — F411 Generalized anxiety disorder: Secondary | ICD-10-CM

## 2022-03-31 MED ORDER — ALPRAZOLAM 0.25 MG PO TABS: 0.2500 mg | ORAL_TABLET | Freq: Two times a day (BID) | ORAL | 2 refills | Status: DC | PRN

## 2022-03-31 MED ORDER — BUPROPION HCL ER (XL) 300 MG PO TB24: 300.0000 mg | ORAL_TABLET | Freq: Every day | ORAL | 1 refills | Status: DC

## 2022-03-31 NOTE — Progress Notes (Signed)
[facility]  Patient ID: Sharon Holmes,  MRN: [id number]  PCP: Haywood Pao, MD  Date of Evaluation: 03/31/2022 Time spent:30 minutes  Chief Complaint:  Chief Complaint   Establish Care    HISTORY/CURRENT STATUS: HPI  To re-establish care.   Moved to [place] a few years ago in order to be closer to her family.  She did not like it and moved back here this past summer.  Likes it very much and plans to stay here.  She got a job with [employer], where she worked before moving.  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  Likes it a lot.   Really just needs med Rfs and to reestablish care.  She was given Xanax in [place], they gave her 0.25 mg.  That is all she ever needed to take anyway.  She always takes it when flying.  Sometimes needs it during the day or evenings after she has had a very busy day at work.  Probably only takes it a couple of times a month on average.  Sometimes gets panicky, will have palpitations and gets slightly short of breath especially when flying.  The Xanax is very helpful.  Patient is able to enjoy things.  Energy and motivation are good.  No extreme sadness, tearfulness, or feelings of hopelessness.  Sleeps well most of the time. ADLs and personal hygiene are normal.   Denies any changes in concentration, making decisions, or remembering things.  Appetite has not changed.  Weight is stable.   Denies suicidal or homicidal thoughts.  Patient denies increased energy with decreased need for sleep, increased talkativeness, racing thoughts, impulsivity or risky behaviors, increased spending, increased libido, grandiosity, increased irritability or anger, paranoia, or hallucinations.  Review of Systems  Constitutional: Negative.   HENT: Negative.    Eyes: Negative.   Respiratory: Negative.    Cardiovascular: Negative.   Gastrointestinal: Negative.   Genitourinary: Negative.    Musculoskeletal: Negative.   Skin: Negative.   Neurological: Negative.   Endo/Heme/Allergies: Negative.   Psychiatric/Behavioral:         See HPI   Individual Medical History/ Review of Systems: Changes? :No   Past medications for mental health diagnoses include: Prozac, Trintellix caused severe constipation, Cymbalta, Effexor, Lexapro, Xanax  Allergies: Amoxicillin, Aspirin, Nsaids, and Trintellix [vortioxetine]  Current Medications:  Current Outpatient Medications:    acetaminophen (TYLENOL) 500 MG tablet, Take 500 mg by mouth every 6 (six) hours as needed., Disp: , Rfl:    ALPRAZolam (XANAX) 0.25 MG tablet, Take 1-2 tablets (0.25-0.5 mg total) by mouth 2 (two) times daily as needed for anxiety., Disp: 60 tablet, Rfl: 2   Calcium Carbonate (CALCIUM 500 PO), Take by mouth., Disp: , Rfl:    cetirizine (ZYRTEC) 10 MG chewable tablet, Chew 10 mg by mouth daily., Disp: , Rfl:    Cyanocobalamin (B-12) 2500 MCG SUBL, Place 1 drop under the tongue daily., Disp: , Rfl:    Ergocalciferol (VITAMIN D2) 50 MCG (2000 UT) TABS, Take 1 tablet by mouth daily., Disp: , Rfl:    Multiple Vitamins-Minerals (MULTIVITAMIN WITH MINERALS) tablet, Take 1 tablet by mouth daily., Disp: , Rfl:    buPROPion (WELLBUTRIN XL) 300 MG 24 hr tablet, Take 1 tablet (300 mg total) by mouth daily., Disp: 90 tablet, Rfl: 1   fluticasone (FLONASE) 50 MCG/ACT nasal spray, Place into both nostrils daily. (Patient not taking: Reported on  03/31/2022), Disp: , Rfl:  Medication Side Effects: none  Family Medical/ Social History: Changes? Yes moved back from [place] in July  MENTAL HEALTH EXAM:  Blood pressure 115/78, pulse 82, height 5' (1.524 m), weight 125 lb (56.7 kg).Body mass index is 24.41 kg/m.  General Appearance: Casual and Well Groomed  Eye Contact:  Good  Speech:  Clear and Coherent and Normal Rate  Volume:  Normal  Mood:  Euthymic  Affect:  Congruent  Thought Process:  Goal Directed and Descriptions of  Associations: Circumstantial  Orientation:  Full (Time, Place, and Person)  Thought Content: Logical   Suicidal Thoughts:  No  Homicidal Thoughts:  No  Memory:  WNL  Judgement:  Good  Insight:  Good  Psychomotor Activity:  Normal  Concentration:  Concentration: Good  Recall:  Good  Fund of Knowledge: Good  Language: Good  Assets:  Desire for Improvement Financial Resources/Insurance Housing Transportation Vocational/Educational  ADL's:  Intact  Cognition: WNL  Prognosis:  Good   DIAGNOSES:    ICD-10-CM   1. Major depressive disorder, recurrent episode, moderate (HCC)  F33.1 buPROPion (WELLBUTRIN XL) 300 MG 24 hr tablet    2. Generalized anxiety disorder  F41.1 buPROPion (WELLBUTRIN XL) 300 MG 24 hr tablet     Receiving Psychotherapy: No   RECOMMENDATIONS:  PDMP reviewed. No controlled substances.  I provided 30 minutes of face to face time during this encounter, including time spent before and after the visit in records review, medical decision making, counseling pertinent to today's visit, and charting.   She is doing well on current medications so no changes will be made.  Continue Xanax 0.25 mg, 1-2 p.o. twice daily as needed anxiety. Continue Wellbutrin XL 300 mg, 1 p.o. daily. Continue vitamins as listed on med list. Return in 6 months.  Donnal Moat, PA-C

## 2022-04-15 ENCOUNTER — Other Ambulatory Visit: Payer: Self-pay

## 2022-04-15 ENCOUNTER — Encounter: Payer: Self-pay | Admitting: Genetic Counselor

## 2022-05-21 ENCOUNTER — Telehealth: Payer: Self-pay | Admitting: Internal Medicine

## 2022-05-21 NOTE — Telephone Encounter (Signed)
Per 3/13 IB reached out to patient to answer questions regarding 3/21 appointment, patient requested to cancel both.

## 2022-05-29 ENCOUNTER — Inpatient Hospital Stay: Payer: BC Managed Care – PPO

## 2022-05-29 ENCOUNTER — Inpatient Hospital Stay: Payer: BC Managed Care – PPO | Admitting: Genetic Counselor

## 2022-09-19 ENCOUNTER — Other Ambulatory Visit: Payer: Self-pay | Admitting: Internal Medicine

## 2022-09-19 DIAGNOSIS — Z1231 Encounter for screening mammogram for malignant neoplasm of breast: Secondary | ICD-10-CM

## 2022-09-22 ENCOUNTER — Ambulatory Visit
Admission: RE | Admit: 2022-09-22 | Discharge: 2022-09-22 | Disposition: A | Discharge status: Home / Self Care | Payer: BC Managed Care – PPO | Source: Ambulatory Visit | Attending: Internal Medicine | Admitting: Internal Medicine

## 2022-09-22 DIAGNOSIS — Z1231 Encounter for screening mammogram for malignant neoplasm of breast: Secondary | ICD-10-CM

## 2022-09-24 ENCOUNTER — Encounter: Payer: Self-pay | Admitting: Radiology

## 2022-09-24 ENCOUNTER — Telehealth: Payer: Self-pay

## 2022-09-24 ENCOUNTER — Other Ambulatory Visit: Payer: Self-pay | Admitting: Internal Medicine

## 2022-09-24 DIAGNOSIS — R928 Other abnormal and inconclusive findings on diagnostic imaging of breast: Secondary | ICD-10-CM

## 2022-09-24 NOTE — Telephone Encounter (Signed)
Yes, we can sign the order. Last seen 2023.

## 2022-09-24 NOTE — Telephone Encounter (Signed)
Orders signed and pt notified and voiced understanding/appreciation.   Will call TBC back to schedule appt.

## 2022-09-24 NOTE — Telephone Encounter (Signed)
JJ pt LVM in triage line stating that screening mammo order was sent by PCP. However, results came back inconclusive and needs diagnostic. Per appt notes from [facility]: "09/24/2022 CX REASON-PT STATED THAT THEY ARE NOT ESTABLISED WITH DR. TISOVEC'S OFFICE, HASN'T BEEN SEEN SINCE 2021, WILL CONTACT OTHER OFFICE TO GET SIGNED OFF, [facility] TO [facility]"  Pt calling to inquire if we would sign orders for diagnostic imaging?  Last AEX 02/26/2022--recall placed for 2024.  Please advise.

## 2022-09-25 ENCOUNTER — Other Ambulatory Visit: Payer: Self-pay | Admitting: Physician Assistant

## 2022-09-25 DIAGNOSIS — F331 Major depressive disorder, recurrent, moderate: Secondary | ICD-10-CM

## 2022-09-25 DIAGNOSIS — F411 Generalized anxiety disorder: Secondary | ICD-10-CM

## 2022-09-29 ENCOUNTER — Ambulatory Visit
Admission: RE | Admit: 2022-09-29 | Discharge: 2022-09-29 | Disposition: A | Discharge status: Home / Self Care | Payer: BC Managed Care – PPO | Source: Ambulatory Visit | Attending: Radiology | Admitting: Radiology

## 2022-09-29 ENCOUNTER — Other Ambulatory Visit: Payer: Self-pay | Admitting: Radiology

## 2022-09-29 ENCOUNTER — Ambulatory Visit: Payer: BC Managed Care – PPO | Admitting: Physician Assistant

## 2022-09-29 ENCOUNTER — Encounter: Payer: Self-pay | Admitting: Physician Assistant

## 2022-09-29 ENCOUNTER — Other Ambulatory Visit: Payer: BC Managed Care – PPO

## 2022-09-29 DIAGNOSIS — R928 Other abnormal and inconclusive findings on diagnostic imaging of breast: Secondary | ICD-10-CM

## 2022-09-29 DIAGNOSIS — F3342 Major depressive disorder, recurrent, in full remission: Secondary | ICD-10-CM | POA: Diagnosis not present

## 2022-09-29 DIAGNOSIS — F331 Major depressive disorder, recurrent, moderate: Secondary | ICD-10-CM | POA: Diagnosis not present

## 2022-09-29 DIAGNOSIS — F411 Generalized anxiety disorder: Secondary | ICD-10-CM

## 2022-09-29 MED ORDER — BUPROPION HCL ER (XL) 300 MG PO TB24: 300.0000 mg | ORAL_TABLET | Freq: Every day | ORAL | 1 refills | Status: DC

## 2022-09-29 MED ORDER — ALPRAZOLAM 0.25 MG PO TABS: 0.2500 mg | ORAL_TABLET | Freq: Two times a day (BID) | ORAL | 2 refills | Status: DC | PRN

## 2022-09-29 NOTE — Progress Notes (Signed)
[facility]  Patient ID: Sharon Holmes,  MRN: [id number]  PCP: Gaspar Garbe, MD  Date of Evaluation: 09/29/2022 Time spent: 22 minutes  Chief Complaint:  Chief Complaint   Follow-up    HISTORY/CURRENT STATUS: HPI  For routine med check  Doing really well. Feels like her meds are effective. Not needing the Xanax very often but when she does, it works. Not having PA, but sometimes she will catastrophize at times.   Patient is able to enjoy things.  Energy and motivation are good.  Work is going well.   No extreme sadness, tearfulness, or feelings of hopelessness.  Sleeps well most of the time. ADLs and personal hygiene are normal.   Denies any changes in concentration, making decisions, or remembering things.  Appetite has not changed.  Weight is stable.  Denies suicidal or homicidal thoughts.  Patient denies increased energy with decreased need for sleep, increased talkativeness, racing thoughts, impulsivity or risky behaviors, increased spending, increased libido, grandiosity, increased irritability or anger, paranoia, or hallucinations.  Individual Medical History/ Review of Systems: Changes? :Yes   had her first mammogram a few weeks ago, right breast was abnl, has f/u today.   Past medications for mental health diagnoses include: Prozac, Trintellix caused severe constipation, Cymbalta, Effexor, Lexapro, Xanax  Allergies: Amoxicillin, Aspirin, Nsaids, and Trintellix [vortioxetine]  Current Medications:  Current Outpatient Medications:    acetaminophen (TYLENOL) 500 MG tablet, Take 500 mg by mouth every 6 (six) hours as needed., Disp: , Rfl:    Calcium Carbonate (CALCIUM 500 PO), Take by mouth., Disp: , Rfl:    cetirizine (ZYRTEC) 10 MG chewable tablet, Chew 10 mg by mouth daily., Disp: , Rfl:    Cyanocobalamin (B-12) 2500 MCG SUBL, Place 1 drop under the tongue daily., Disp: , Rfl:    Ergocalciferol (VITAMIN D2) 50 MCG (2000 UT) TABS, Take 1 tablet by mouth  daily., Disp: , Rfl:    ALPRAZolam (XANAX) 0.25 MG tablet, Take 1-2 tablets (0.25-0.5 mg total) by mouth 2 (two) times daily as needed for anxiety., Disp: 60 tablet, Rfl: 2   buPROPion (WELLBUTRIN XL) 300 MG 24 hr tablet, Take 1 tablet (300 mg total) by mouth daily., Disp: 90 tablet, Rfl: 1 Medication Side Effects: none  Family Medical/ Social History: Changes? None  MENTAL HEALTH EXAM:  There were no vitals taken for this visit.There is no height or weight on file to calculate BMI.  General Appearance: Casual and Well Groomed  Eye Contact:  Good  Speech:  Clear and Coherent and Normal Rate  Volume:  Normal  Mood:  Euthymic  Affect:  Congruent  Thought Process:  Goal Directed and Descriptions of Associations: Circumstantial  Orientation:  Full (Time, Place, and Person)  Thought Content: Logical   Suicidal Thoughts:  No  Homicidal Thoughts:  No  Memory:  WNL  Judgement:  Good  Insight:  Good  Psychomotor Activity:  Normal  Concentration:  Concentration: Good and Attention Span: Good  Recall:  Good  Fund of Knowledge: Good  Language: Good  Assets:  Desire for Improvement Financial Resources/Insurance Housing Transportation Vocational/Educational  ADL's:  Intact  Cognition: WNL  Prognosis:  Good   DIAGNOSES:    ICD-10-CM   1. Recurrent major depressive disorder, in full remission (HCC)  F33.42     2. Generalized anxiety disorder  F41.1 buPROPion (WELLBUTRIN XL) 300 MG 24 hr tablet    3. Major depressive disorder, recurrent episode, moderate (HCC)  F33.1 buPROPion (WELLBUTRIN XL) 300 MG 24  hr tablet     Receiving Psychotherapy: No   RECOMMENDATIONS:  PDMP reviewed.  Xanax filled 03/31/2022. I provided 22 minutes of face to face time during this encounter, including time spent before and after the visit in records review, medical decision making, counseling pertinent to today's visit, and charting.   Doing well so no changes are needed.   Continue Xanax 0.25 mg, 1-2  p.o. twice daily as needed anxiety. Continue Wellbutrin XL 300 mg, 1 p.o. daily. Continue vitamins as listed on med list. Return in 6 months.  Melony Overly, PA-C

## 2022-10-03 NOTE — Telephone Encounter (Signed)
Pt had dx imaging performed on 09/29/2022. Results ready to be reviewed in EMR. Will route to provider for final review and close.

## 2022-10-09 ENCOUNTER — Encounter (HOSPITAL_COMMUNITY): Payer: Self-pay

## 2022-10-09 ENCOUNTER — Ambulatory Visit (HOSPITAL_COMMUNITY)
Admission: RE | Admit: 2022-10-09 | Discharge: 2022-10-09 | Disposition: A | Discharge status: Home / Self Care | Payer: BC Managed Care – PPO | Source: Ambulatory Visit | Attending: Emergency Medicine | Admitting: Emergency Medicine

## 2022-10-09 VITALS — BP 110/77 | HR 99 | Temp 97.9°F | Resp 14

## 2022-10-09 DIAGNOSIS — H65191 Other acute nonsuppurative otitis media, right ear: Secondary | ICD-10-CM | POA: Diagnosis not present

## 2022-10-09 MED ORDER — CEFDINIR 300 MG PO CAPS: 300.0000 mg | ORAL_CAPSULE | Freq: Two times a day (BID) | ORAL | 0 refills | Status: AC

## 2022-10-09 NOTE — ED Provider Notes (Signed)
[facility]    CSN: [id number] Arrival date & time: 10/09/22  1447      History   Chief Complaint Chief Complaint  Patient presents with   Ear Fullness    I've had a ringing in my right ear since last night, and have lost some hearing in my right ear. I had a headache that may be unrelated, and I'm slightly dizzy. - Entered by patient    HPI Sharon Holmes is a 42 y.o. female.  Last night developed right ear pressure, reduce hearing, and ringing. Some dizziness and mild headache.  Denies congestion or runny nose, fever No recent swimming/diving  Has not attempted intervention   Past Medical History:  Diagnosis Date   Anxiety    Depression    Dizziness    GERD (gastroesophageal reflux disease)    Migraine    with aura around menses   Ovarian cancer (HCC) 2008   Stage 1A    Patient Active Problem List   Diagnosis Date Noted   Major depressive disorder, recurrent episode, moderate (HCC) 12/27/2017   Generalized anxiety disorder 12/27/2017   Cyst of right ovary    Endometriosis of ovary    Migraines 02/20/2015   Ovarian cancer (HCC) 11/06/2011    Past Surgical History:  Procedure Laterality Date   APPENDECTOMY  08/2006   BUNIONECTOMY  02/2007   CYSTECTOMY  07/2008   from right ovary   ROBOTIC ASSISTED LAPAROSCOPIC OVARIAN CYSTECTOMY Right 06/14/2015   Procedure: ROBOTIC LAPAROSCOPIC RIGHT OVARIAN CYSTECTOMY/;  Surgeon: Adolphus Birchwood, MD;  Location: [facility];  Service: Gynecology;  Laterality: Right;   SALPINGOOPHORECTOMY  07/2006   Left for Stage 1A ovarian cancer   WISDOM TOOTH EXTRACTION  2015    OB History     Gravida  0   Para  0   Term  0   Preterm  0   AB  0   Living  0      SAB  0   IAB  0   Ectopic  0   Multiple  0   Live Births               Home Medications    Prior to Admission medications   Medication Sig Start Date End Date Taking? Authorizing Provider  cefdinir (OMNICEF) 300 MG capsule Take 1 capsule (300 mg total)  by mouth 2 (two) times daily for 5 days. 10/09/22 10/14/22 Yes Kayren Holck, Lurena Joiner, PA-C  acetaminophen (TYLENOL) 500 MG tablet Take 500 mg by mouth every 6 (six) hours as needed.    [provider]  ALPRAZolam Prudy Feeler) 0.25 MG tablet Take 1-2 tablets (0.25-0.5 mg total) by mouth 2 (two) times daily as needed for anxiety. 09/29/22   Cherie Ouch, PA-C  buPROPion (WELLBUTRIN XL) 300 MG 24 hr tablet Take 1 tablet (300 mg total) by mouth daily. 09/29/22   Melony Overly T, PA-C  Calcium Carbonate (CALCIUM 500 PO) Take by mouth.    [provider]  cetirizine (ZYRTEC) 10 MG chewable tablet Chew 10 mg by mouth daily.    [provider]  Cyanocobalamin (B-12) 2500 MCG SUBL Place 1 drop under the tongue daily.    [provider]  Ergocalciferol (VITAMIN D2) 50 MCG (2000 UT) TABS Take 1 tablet by mouth daily.    [provider]    Family History Family History  Problem Relation Age of Onset   Fibromyalgia Mother    Thyroid disease Mother    Blindness  Father    Alcohol abuse Father    Diabetes Maternal Aunt    Heart attack Maternal Grandfather    Heart disease Maternal Grandfather        Heart attack; CAD    Dementia Maternal Grandmother     Social History Social History   Tobacco Use   Smoking status: Never   Smokeless tobacco: Never  Vaping Use   Vaping status: Never Used  Substance Use Topics   Alcohol use: Yes    Comment: 1-2 per month   Drug use: No     Allergies   Amoxicillin, Aspirin, Nsaids, and Trintellix [vortioxetine]   Review of Systems Review of Systems As per HPI  Physical Exam Triage Vital Signs ED Triage Vitals  Encounter Vitals Group     BP 10/09/22 1505 110/77     Systolic BP Percentile --      Diastolic BP Percentile --      Pulse Rate 10/09/22 1505 99     Resp 10/09/22 1505 14     Temp 10/09/22 1505 97.9 F (36.6 C)     Temp Source 10/09/22 1505 Oral     SpO2 10/09/22 1505 98 %     Weight --      Height --       Head Circumference --      Peak Flow --      Pain Score 10/09/22 1506 4     Pain Loc --      Pain Education --      Exclude from Growth Chart --    No data found.  Updated Vital Signs BP 110/77 (BP Location: Left Arm)   Pulse 99   Temp 97.9 F (36.6 C) (Oral)   Resp 14   LMP 08/30/2022   SpO2 98%   Physical Exam Vitals and nursing note reviewed.  Constitutional:      General: She is not in acute distress. HENT:     Right Ear: External ear normal. No tenderness. Tympanic membrane is bulging.     Left Ear: Tympanic membrane, ear canal and external ear normal.     Ears:     Comments: Full appearing right TM with cloudy fluid. Left ear normal     Nose: No rhinorrhea.     Mouth/Throat:     Mouth: Mucous membranes are moist.     Pharynx: Oropharynx is clear.  Eyes:     Conjunctiva/sclera: Conjunctivae normal.  Cardiovascular:     Rate and Rhythm: Normal rate and regular rhythm.     Heart sounds: Normal heart sounds.  Pulmonary:     Effort: Pulmonary effort is normal.     Breath sounds: Normal breath sounds.  Neurological:     Mental Status: She is alert and oriented to person, place, and time.    UC Treatments / Results  Labs (all labs ordered are listed, but only abnormal results are displayed) Labs Reviewed - No data to display  EKG  Radiology No results found.  Procedures Procedures   Medications Ordered in UC Medications - No data to display  Initial Impression / Assessment and Plan / UC Course  I have reviewed the triage vital signs and the nursing notes.  Pertinent labs & imaging results that were available during my care of the patient were reviewed by me and considered in my medical decision making (see chart for details).  Right otitis media Likely cause of pressure, ringing, dizziness, headache.  Cefdinir BID x 5 days Her  PCN allergy is leg cramps and fever. Safe to use cephalosporin at this time. Recommend nasal spray as well Return  precautions discussed. No questions at this time, agrees to plan  Final Clinical Impressions(s) / UC Diagnoses   Final diagnoses:  Other non-recurrent acute nonsuppurative otitis media of right ear     Discharge Instructions      Please take medication as prescribed. Take with food to avoid upset stomach. Finish the full course to treat ear infection.  I also recommend once daily nasal spray such as flonase This can reduce ear pain/pressure       ED Prescriptions     Medication Sig Dispense Auth. Provider   cefdinir (OMNICEF) 300 MG capsule Take 1 capsule (300 mg total) by mouth 2 (two) times daily for 5 days. 10 capsule Bunnie Rehberg, Lurena Joiner, PA-C      PDMP not reviewed this encounter.   Marlow Baars, [place] 10/09/22 1557

## 2022-10-09 NOTE — Discharge Instructions (Addendum)
Please take medication as prescribed. Take with food to avoid upset stomach. Finish the full course to treat ear infection.  I also recommend once daily nasal spray such as flonase This can reduce ear pain/pressure

## 2022-10-09 NOTE — ED Triage Notes (Signed)
Patient c/o ear fullness/clogged since last night. Patient c/o dizziness and a headache.  Patient has not taken any medication for her symptoms.

## 2022-11-20 ENCOUNTER — Ambulatory Visit: Payer: BC Managed Care – PPO | Admitting: Family Medicine

## 2022-11-20 ENCOUNTER — Encounter: Payer: Self-pay | Admitting: Family Medicine

## 2022-11-20 VITALS — BP 106/70 | HR 99 | Temp 98.3°F | Wt 121.0 lb

## 2022-11-20 DIAGNOSIS — G43109 Migraine with aura, not intractable, without status migrainosus: Secondary | ICD-10-CM | POA: Diagnosis not present

## 2022-11-20 DIAGNOSIS — F331 Major depressive disorder, recurrent, moderate: Secondary | ICD-10-CM | POA: Diagnosis not present

## 2022-11-20 DIAGNOSIS — E7800 Pure hypercholesterolemia, unspecified: Secondary | ICD-10-CM

## 2022-11-20 DIAGNOSIS — F411 Generalized anxiety disorder: Secondary | ICD-10-CM

## 2022-11-20 DIAGNOSIS — Z8543 Personal history of malignant neoplasm of ovary: Secondary | ICD-10-CM

## 2022-11-20 NOTE — Progress Notes (Signed)
New Patient Office Visit  Subjective    Patient ID: Sharon Holmes, female    DOB: 09-10-1980  Age: 42 y.o. MRN: [id number]  CC:  Chief Complaint  Patient presents with   Establish Care    New Patient    HPI Sharon Holmes presents to establish care Moved back from [facility] July 2023.  Lived here previously.   She has a therapist, Matt Sees Melony Overly for medications.   OB/GYN [facility] wants her to have mammograms every 6 months.   Hx of ovarian cancer, stage 1A. Did not require chemo or radiation.   Migraines- states she used to take triptans but  Triggers- stress, fresh cut grass, menstrual cycle   1-2 per month. Takes Tylenol.   Trouble falling asleep.   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      11/20/2022    2:58 PM  Depression screen PHQ 2/9  Decreased Interest 1  Down, Depressed, Hopeless 1  PHQ - 2 Score 2  Altered sleeping 2  Tired, decreased energy 1  Change in appetite 0  Feeling bad or failure about yourself  1  Trouble concentrating 0  Moving slowly or fidgety/restless 0  Suicidal thoughts 0  PHQ-9 Score 6  Difficult doing work/chores Somewhat difficult      Outpatient Encounter Medications as of 11/20/2022  Medication Sig   acetaminophen (TYLENOL) 500 MG tablet Take 500 mg by mouth every 6 (six) hours as needed.   ALPRAZolam (XANAX) 0.25 MG tablet Take 1-2 tablets (0.25-0.5 mg total) by mouth 2 (two) times daily as needed for anxiety.   buPROPion (WELLBUTRIN XL) 300 MG 24 hr tablet Take 1 tablet (300 mg total) by mouth daily.   Calcium Carbonate (CALCIUM 500 PO) Take by mouth.   cetirizine (ZYRTEC) 10 MG chewable tablet Chew 10 mg by mouth daily.   Cyanocobalamin (B-12) 2500 MCG SUBL Place 1 drop under the tongue daily.   Ergocalciferol (VITAMIN D2) 50 MCG (2000 UT) TABS Take 1 tablet by mouth daily.   No facility-administered encounter medications on file as of 11/20/2022.    Past Medical History:  Diagnosis Date    Anxiety    Depression    Dizziness    GERD (gastroesophageal reflux disease)    Migraine    with aura around menses   Ovarian cancer (HCC) 2008   Stage 1A    Past Surgical History:  Procedure Laterality Date   APPENDECTOMY  08/2006   BUNIONECTOMY  02/2007   CYSTECTOMY  07/2008   from right ovary   ROBOTIC ASSISTED LAPAROSCOPIC OVARIAN CYSTECTOMY Right 06/14/2015   Procedure: ROBOTIC LAPAROSCOPIC RIGHT OVARIAN CYSTECTOMY/;  Surgeon: Adolphus Birchwood, MD;  Location: [facility];  Service: Gynecology;  Laterality: Right;   SALPINGOOPHORECTOMY  07/2006   Left for Stage 1A ovarian cancer   WISDOM TOOTH EXTRACTION  2015    Family History  Problem Relation Age of Onset   Fibromyalgia Mother    Thyroid disease Mother    Blindness Father    Alcohol abuse Father    Diabetes Maternal Aunt    Heart attack Maternal Grandfather    Heart disease Maternal Grandfather        Heart attack; CAD    Dementia Maternal Grandmother     Social History   Socioeconomic History   Marital status: Single    Spouse name: Not on file   Number of children: Not on file   Years of education: Not on file  Highest education level: Master's degree (e.g., MA, MS, MEng, MEd, MSW, MBA)  Occupational History   Not on file  Tobacco Use   Smoking status: Never   Smokeless tobacco: Never  Vaping Use   Vaping status: Never Used  Substance and Sexual Activity   Alcohol use: Yes    Comment: 1-2 per month   Drug use: No   Sexual activity: Not Currently    Partners: Male    Birth control/protection: Abstinence  Other Topics Concern   Not on file  Social History Narrative   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  Respiratory:  Negative for shortness of breath.   Cardiovascular:  Negative for chest pain, palpitations and leg swelling.  Gastrointestinal:  Negative for abdominal pain, constipation, diarrhea, nausea and vomiting.  Neurological:  Negative for dizziness.  Psychiatric/Behavioral:  Positive for depression. Negative for substance abuse and suicidal ideas. The patient is nervous/anxious.         Objective    BP 106/70 (BP Location: Left Arm, Patient Position: Sitting, Cuff Size: Normal)   Pulse 99   Temp 98.3 F (36.8 C) (Oral)   Wt 121 lb (54.9 kg)   SpO2 98%   BMI 23.63 kg/m   Physical Exam Constitutional:      General: She is not in acute distress.    Appearance: She is not ill-appearing.  Eyes:     Extraocular Movements: Extraocular movements intact.     Conjunctiva/sclera: Conjunctivae normal.  Cardiovascular:     Rate and Rhythm: Normal rate.  Pulmonary:     Effort: Pulmonary effort is normal.  Musculoskeletal:     Cervical back: Normal range of motion and neck supple.  Skin:    General: Skin is warm and dry.  Neurological:     General: No focal deficit present.     Mental Status: She is alert and oriented to person, place, and time.  Psychiatric:        Mood and Affect: Mood normal.        Behavior: Behavior normal.        Thought Content: Thought content normal.         Assessment & Plan:   Problem List Items Addressed This Visit       Cardiovascular and Mediastinum   Migraines     Other   Generalized anxiety disorder   Major depressive disorder, recurrent episode, moderate (HCC)   Other Visit Diagnoses     Pure hypercholesterolemia    -  Primary   History of ovarian cancer          Here to establish care.  Continue close follow up with OB/GYN and get mammograms as recommended.  Continue seeing BH and counselor.  Follow up  for fasting CPE.   Return for fasting CPE at their convenience.   Hetty Blend, NP-C

## 2023-02-19 ENCOUNTER — Encounter: Payer: Self-pay | Admitting: Family Medicine

## 2023-02-19 ENCOUNTER — Ambulatory Visit: Payer: BC Managed Care – PPO | Admitting: Family Medicine

## 2023-02-19 VITALS — BP 114/70 | HR 90 | Temp 97.8°F | Ht 60.0 in | Wt 123.0 lb

## 2023-02-19 DIAGNOSIS — Z0001 Encounter for general adult medical examination with abnormal findings: Secondary | ICD-10-CM

## 2023-02-19 DIAGNOSIS — Z7185 Encounter for immunization safety counseling: Secondary | ICD-10-CM

## 2023-02-19 DIAGNOSIS — E7800 Pure hypercholesterolemia, unspecified: Secondary | ICD-10-CM | POA: Diagnosis not present

## 2023-02-19 LAB — CBC WITH DIFFERENTIAL/PLATELET
Basophils Absolute: 0.1 10*3/uL (ref 0.0–0.1)
Basophils Relative: 1 % (ref 0.0–3.0)
Eosinophils Absolute: 0.4 10*3/uL (ref 0.0–0.7)
Eosinophils Relative: 3.9 % (ref 0.0–5.0)
HCT: 42.7 % (ref 36.0–46.0)
Hemoglobin: 14.3 g/dL (ref 12.0–15.0)
Lymphocytes Relative: 31.7 % (ref 12.0–46.0)
Lymphs Abs: 3.1 10*3/uL (ref 0.7–4.0)
MCHC: 33.5 g/dL (ref 30.0–36.0)
MCV: 94 fL (ref 78.0–100.0)
Monocytes Absolute: 0.7 10*3/uL (ref 0.1–1.0)
Monocytes Relative: 7.7 % (ref 3.0–12.0)
Neutro Abs: 5.4 10*3/uL (ref 1.4–7.7)
Neutrophils Relative %: 55.7 % (ref 43.0–77.0)
Platelets: 294 10*3/uL (ref 150.0–400.0)
RBC: 4.54 Mil/uL (ref 3.87–5.11)
RDW: 13.5 % (ref 11.5–15.5)
WBC: 9.7 10*3/uL (ref 4.0–10.5)

## 2023-02-19 LAB — COMPREHENSIVE METABOLIC PANEL
ALT: 31 U/L (ref 0–35)
AST: 25 U/L (ref 0–37)
Albumin: 4.6 g/dL (ref 3.5–5.2)
Alkaline Phosphatase: 69 U/L (ref 39–117)
BUN: 12 mg/dL (ref 6–23)
CO2: 26 meq/L (ref 19–32)
Calcium: 9.5 mg/dL (ref 8.4–10.5)
Chloride: 106 meq/L (ref 96–112)
Creatinine, Ser: 0.66 mg/dL (ref 0.40–1.20)
GFR: 109.41 mL/min (ref >60.00)
Glucose, Bld: 90 mg/dL (ref 70–99)
Potassium: 3.9 meq/L (ref 3.5–5.1)
Sodium: 139 meq/L (ref 135–145)
Total Bilirubin: 0.3 mg/dL (ref 0.2–1.2)
Total Protein: 7.3 g/dL (ref 6.0–8.3)

## 2023-02-19 LAB — LIPID PANEL
Cholesterol: 201 mg/dL — ABNORMAL HIGH (ref 0–200)
HDL: 56 mg/dL (ref >39.00)
LDL Cholesterol: 115 mg/dL — ABNORMAL HIGH (ref 0–99)
NonHDL: 145.38
Total CHOL/HDL Ratio: 4
Triglycerides: 153 mg/dL — ABNORMAL HIGH (ref 0.0–149.0)
VLDL: 30.6 mg/dL (ref 0.0–40.0)

## 2023-02-19 NOTE — Progress Notes (Signed)
Complete physical exam  Patient: Sharon Holmes   DOB: [birth year]   42 y.o. Female  MRN: [id number]  Subjective:    Chief Complaint  Patient presents with   Annual Exam   She is here for a complete physical exam.  Other providers: OB/GYN- Dr. Kennith Center Psychologist- Melony Overly, PA Dermatologist   Mammogram done   Last Tdap ? 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     Health Maintenance  Topic Date Due   HIV Screening  Never done   Hepatitis C Screening  Never done   DTaP/Tdap/Td vaccine (1 - Tdap) Never done   COVID-19 Vaccine (1) 03/07/2023*   Flu Shot  06/08/2023*   Pap with HPV screening  02/27/2027   HPV Vaccine  Completed  *Topic was postponed. The date shown is not the original due date.    Wears seatbelt always, smoke detectors in home and functioning, does not text while driving, feels safe in home environment.  Depression screening:    11/20/2022    2:58 PM  Depression screen PHQ 2/9  Decreased Interest 1  Down, Depressed, Hopeless 1  PHQ - 2 Score 2  Altered sleeping 2  Tired, decreased energy 1  Change in appetite 0  Feeling bad or failure about yourself  1  Trouble concentrating 0  Moving slowly or fidgety/restless 0  Suicidal thoughts 0  PHQ-9 Score 6  Difficult doing work/chores Somewhat difficult   Anxiety Screening:     No data to display          Vision:Within last year and Dental: No current dental problems and Receives regular dental care  Patient Active Problem List   Diagnosis Date Noted   Major depressive disorder, recurrent episode, moderate (HCC) 12/27/2017   Generalized anxiety disorder 12/27/2017   Cyst of right ovary    Endometriosis of ovary    Migraines 02/20/2015   Ovarian cancer (HCC) 11/06/2011   Past Medical History:  Diagnosis Date   Anxiety    Depression    Dizziness    GERD (gastroesophageal reflux disease)    Migraine    with aura around menses    Ovarian cancer (HCC) 2008   Stage 1A   Past Surgical History:  Procedure Laterality Date   APPENDECTOMY  08/2006   BUNIONECTOMY  02/2007   CYSTECTOMY  07/2008   from right ovary   ROBOTIC ASSISTED LAPAROSCOPIC OVARIAN CYSTECTOMY Right 06/14/2015   Procedure: ROBOTIC LAPAROSCOPIC RIGHT OVARIAN CYSTECTOMY/;  Surgeon: Adolphus Birchwood, MD;  Location: [facility];  Service: Gynecology;  Laterality: Right;   SALPINGOOPHORECTOMY  07/2006   Left for Stage 1A ovarian cancer   WISDOM TOOTH EXTRACTION  2015   Social History   Tobacco Use   Smoking status: Never   Smokeless tobacco: Never  Vaping Use   Vaping status: Never Used  Substance Use Topics   Alcohol use: Yes    Comment: 1-2 per month   Drug use: No      Patient Care Team: Avanell Shackleton, NP-C as PCP - General (Family Medicine) Romualdo Bolk, MD (Inactive) as Consulting Physician (Obstetrics and Gynecology)   Outpatient Medications Prior to Visit  Medication Sig   acetaminophen (TYLENOL) 500 MG tablet Take 500 mg by mouth every 6 (six) hours as needed.   ALPRAZolam (XANAX) 0.25 MG tablet Take 1-2 tablets (0.25-0.5 mg total) by mouth 2 (two) times daily as needed for anxiety.   buPROPion (  WELLBUTRIN XL) 300 MG 24 hr tablet Take 1 tablet (300 mg total) by mouth daily.   Calcium Carbonate (CALCIUM 500 PO) Take by mouth.   cetirizine (ZYRTEC) 10 MG chewable tablet Chew 10 mg by mouth daily.   Cyanocobalamin (B-12) 2500 MCG SUBL Place 1 drop under the tongue daily.   Ergocalciferol (VITAMIN D2) 50 MCG (2000 UT) TABS Take 1 tablet by mouth daily.   No facility-administered medications prior to visit.    Review of Systems  Constitutional:  Negative for chills, fever, malaise/fatigue and weight loss.  HENT:  Negative for congestion, ear pain, sinus pain and sore throat.   Eyes:  Negative for blurred vision, double vision and pain.  Respiratory:  Negative for cough, shortness of breath and wheezing.   Cardiovascular:  Negative for  chest pain, palpitations and leg swelling.  Gastrointestinal:  Negative for abdominal pain, constipation, diarrhea, nausea and vomiting.  Genitourinary:  Negative for dysuria, frequency and urgency.  Musculoskeletal:  Negative for back pain, joint pain and myalgias.  Skin:  Negative for rash.  Neurological:  Negative for dizziness, tingling, focal weakness and headaches.  Psychiatric/Behavioral:  Negative for depression and suicidal ideas. The patient is not nervous/anxious.        Objective:    BP 114/70 (BP Location: Left Arm, Patient Position: Sitting, Cuff Size: Large)   Pulse 90   Temp 97.8 F (36.6 C) (Temporal)   Ht 5' (1.524 m)   Wt 123 lb (55.8 kg)   SpO2 99%   BMI 24.02 kg/m  BP Readings from Last 3 Encounters:  02/19/23 114/70  11/20/22 106/70  10/09/22 110/77   Wt Readings from Last 3 Encounters:  02/19/23 123 lb (55.8 kg)  11/20/22 121 lb (54.9 kg)  02/26/22 125 lb (56.7 kg)    Physical Exam Constitutional:      General: She is not in acute distress. HENT:     Right Ear: Tympanic membrane, ear canal and external ear normal.     Left Ear: Tympanic membrane, ear canal and external ear normal.     Nose: Nose normal.     Mouth/Throat:     Mouth: Mucous membranes are moist.     Pharynx: Oropharynx is clear.  Eyes:     Extraocular Movements: Extraocular movements intact.     Conjunctiva/sclera: Conjunctivae normal.     Pupils: Pupils are equal, round, and reactive to light.  Neck:     Thyroid: No thyroid mass, thyromegaly or thyroid tenderness.  Cardiovascular:     Rate and Rhythm: Normal rate and regular rhythm.     Pulses: Normal pulses.     Heart sounds: Normal heart sounds.  Pulmonary:     Effort: Pulmonary effort is normal.     Breath sounds: Normal breath sounds.  Abdominal:     General: Bowel sounds are normal.     Palpations: Abdomen is soft.     Tenderness: There is no abdominal tenderness. There is no right CVA tenderness, left CVA  tenderness, guarding or rebound.  Musculoskeletal:        General: Normal range of motion.     Cervical back: Normal range of motion and neck supple. No tenderness.     Right lower leg: No edema.     Left lower leg: No edema.  Lymphadenopathy:     Cervical: No cervical adenopathy.  Skin:    General: Skin is warm and dry.     Findings: No lesion or rash.  Neurological:  General: No focal deficit present.     Mental Status: She is alert and oriented to person, place, and time.     Cranial Nerves: No cranial nerve deficit.     Sensory: No sensory deficit.     Motor: No weakness.     Gait: Gait normal.  Psychiatric:        Mood and Affect: Mood normal.        Behavior: Behavior normal.        Thought Content: Thought content normal.      No results found for any visits on 02/19/23.    Assessment & Plan:    Routine Health Maintenance and Physical Exam  Problem List Items Addressed This Visit   None Visit Diagnoses       Encounter for general adult medical examination with abnormal findings    -  Primary   Relevant Orders   Comprehensive metabolic panel   CBC with Differential/Platelet     Pure hypercholesterolemia       Relevant Orders   Lipid panel     Immunization counseling          Preventive health care reviewed. Sees OB/GYN, dermatologist and psychologist.  Counseling on healthy lifestyle including diet and exercise.  Recommend regular dental and eye exams.  Immunizations reviewed.  Discussed safety. Declines Tdap and flu vaccines.    Return in about 1 year (around 02/19/2024).     Hetty Blend, NP-C

## 2023-03-25 ENCOUNTER — Encounter: Payer: Self-pay | Admitting: Obstetrics and Gynecology

## 2023-03-25 ENCOUNTER — Ambulatory Visit (INDEPENDENT_AMBULATORY_CARE_PROVIDER_SITE_OTHER): Payer: 59 | Admitting: Obstetrics and Gynecology

## 2023-03-25 VITALS — BP 124/72 | HR 100 | Ht 60.0 in | Wt 122.0 lb

## 2023-03-25 DIAGNOSIS — Z01419 Encounter for gynecological examination (general) (routine) without abnormal findings: Secondary | ICD-10-CM | POA: Insufficient documentation

## 2023-03-25 DIAGNOSIS — N926 Irregular menstruation, unspecified: Secondary | ICD-10-CM

## 2023-03-25 DIAGNOSIS — Z8543 Personal history of malignant neoplasm of ovary: Secondary | ICD-10-CM

## 2023-03-25 DIAGNOSIS — N898 Other specified noninflammatory disorders of vagina: Secondary | ICD-10-CM

## 2023-03-25 LAB — WET PREP FOR TRICH, YEAST, CLUE

## 2023-03-25 NOTE — Patient Instructions (Signed)
 Consider taking Vitamin E, D and B vitamins as well as fish oil and/or ginger starting 2 days before your cycle to decrease cramping.  Health Maintenance, Female Adopting a healthy lifestyle and getting preventive care are important in promoting health and wellness. Ask your health care provider about: The right schedule for you to have regular tests and exams. Things you can do on your own to prevent diseases and keep yourself healthy. What should I know about diet, weight, and exercise? Eat a healthy diet  Eat a diet that includes plenty of vegetables, fruits, low-fat dairy products, and lean protein. Do not eat a lot of foods that are high in solid fats, added sugars, or sodium. Maintain a healthy weight Body mass index (BMI) is used to identify weight problems. It estimates body fat based on height and weight. Your health care provider can help determine your BMI and help you achieve or maintain a healthy weight. Get regular exercise Get regular exercise. This is one of the most important things you can do for your health. Most adults should: Exercise for at least 150 minutes each week. The exercise should increase your heart rate and make you sweat (moderate-intensity exercise). Do strengthening exercises at least twice a week. This is in addition to the moderate-intensity exercise. Spend less time sitting. Even light physical activity can be beneficial. Watch cholesterol and blood lipids Have your blood tested for lipids and cholesterol at 41 years of age, then have this test every 5 years. Have your cholesterol levels checked more often if: Your lipid or cholesterol levels are high. You are older than 41 years of age. You are at high risk for heart disease. What should I know about cancer screening? Depending on your health history and family history, you may need to have cancer screening at various ages. This may include screening for: Breast cancer. Cervical cancer. Colorectal  cancer. Skin cancer. Lung cancer. What should I know about heart disease, diabetes, and high blood pressure? Blood pressure and heart disease High blood pressure causes heart disease and increases the risk of stroke. This is more likely to develop in people who have high blood pressure readings or are overweight. Have your blood pressure checked: Every 3-5 years if you are 15-24 years of age. Every year if you are 58 years old or older. Diabetes Have regular diabetes screenings. This checks your fasting blood sugar level. Have the screening done: Once every three years after age 40 if you are at a normal weight and have a low risk for diabetes. More often and at a younger age if you are overweight or have a high risk for diabetes. What should I know about preventing infection? Hepatitis B If you have a higher risk for hepatitis B, you should be screened for this virus. Talk with your health care provider to find out if you are at risk for hepatitis B infection. Hepatitis C Testing is recommended for: Everyone born from [birth year] through 1963. Anyone with known risk factors for hepatitis C. Sexually transmitted infections (STIs) Get screened for STIs, including gonorrhea and chlamydia, if: You are sexually active and are younger than 41 years of age. You are older than 41 years of age and your health care provider tells you that you are at risk for this type of infection. Your sexual activity has changed since you were last screened, and you are at increased risk for chlamydia or gonorrhea. Ask your health care provider if you are at risk. Ask your  health care provider about whether you are at high risk for HIV. Your health care provider may recommend a prescription medicine to help prevent HIV infection. If you choose to take medicine to prevent HIV, you should first get tested for HIV. You should then be tested every 3 months for as long as you are taking the medicine. Pregnancy If you are  about to stop having your period (premenopausal) and you may become pregnant, seek counseling before you get pregnant. Take 400 to 800 micrograms (mcg) of folic acid every day if you become pregnant. Ask for birth control (contraception) if you want to prevent pregnancy. Osteoporosis and menopause Osteoporosis is a disease in which the bones lose minerals and strength with aging. This can result in bone fractures. If you are 69 years old or older, or if you are at risk for osteoporosis and fractures, ask your health care provider if you should: Be screened for bone loss. Take a calcium or vitamin D supplement to lower your risk of fractures. Be given hormone replacement therapy (HRT) to treat symptoms of menopause. Follow these instructions at home: Alcohol use Do not drink alcohol if: Your health care provider tells you not to drink. You are pregnant, may be pregnant, or are planning to become pregnant. If you drink alcohol: Limit how much you have to: 0-1 drink a day. Know how much alcohol is in your drink. In the [place] one drink equals one 12 oz bottle of beer (355 mL), one 5 oz glass of wine (148 mL), or one 1 oz glass of hard liquor (44 mL). Lifestyle Do not use any products that contain nicotine or tobacco. These products include cigarettes, chewing tobacco, and vaping devices, such as e-cigarettes. If you need help quitting, ask your health care provider. Do not use street drugs. Do not share needles. Ask your health care provider for help if you need support or information about quitting drugs. General instructions Schedule regular health, dental, and eye exams. Stay current with your vaccines. Tell your health care provider if: You often feel depressed. You have ever been abused or do not feel safe at home. Summary Adopting a healthy lifestyle and getting preventive care are important in promoting health and wellness. Follow your health care provider's instructions about  healthy diet, exercising, and getting tested or screened for diseases. Follow your health care provider's instructions on monitoring your cholesterol and blood pressure. This information is not intended to replace advice given to you by your health care provider. Make sure you discuss any questions you have with your health care provider. Document Revised: 07/16/2020 Document Reviewed: 07/16/2020 Elsevier Patient Education  2024 [employer].

## 2023-03-25 NOTE — Progress Notes (Addendum)
 43 y.o. G60P0000 female with history of stage IA ovarian cancer (diagnosed 2008, status post LSO), endometriosis (diagnosed 2017, s/p right ovarian cystectomy for cystadenoma, yearly CA125 recommended by GYN oncology, declined genetic testing in 2020 per Dr. Pearly Bound note), maternal grandmother took DES here for annual exam. Single. School counselor, virtual.  Pt reports light spotting of tissue during her menses for ~CD1-4. Light flow on CD5, then back to spotting for a few days. This has been going on for a while. Reports moderate cramping at times.    Noticed some vaginal irritations usually after menstrual cycle.  Patient's last menstrual period was 03/05/2023 (approximate). Period Cycle (Days): 5 Period Pattern: Regular Menstrual Flow: Light Menstrual Control: Panty liner, Maxi pad Dysmenorrhea: (!) Mild Dysmenorrhea Symptoms: Headache, Cramping  Abnormal bleeding: as noted above Pelvic discharge or pain: yes with menses Breast mass, nipple discharge or skin changes : none Birth control: abstinence Last PAP:     Component Value Date/Time   DIAGPAP  02/26/2022 1441    - Negative for intraepithelial lesion or malignancy (NILM)   DIAGPAP  03/30/2017 0000    NEGATIVE FOR INTRAEPITHELIAL LESIONS OR MALIGNANCY.   HPVHIGH Negative 02/26/2022 1441   ADEQPAP  02/26/2022 1441    Satisfactory for evaluation; transformation zone component PRESENT.   ADEQPAP  03/30/2017 0000    Satisfactory for evaluation  endocervical/transformation zone component PRESENT.   Last mammogram: 09/29/2022 BI-RADS 3, density C, follow-up mammogram scheduled for this month Sexually active: no  Exercising: yes, walk Smoker: no  GYN HISTORY: Stage IA ovarian cancer (diagnosed 2018, status post LSO) Endometriosis Cystadenoma Grandmother took DES  OB History  Gravida Para Term Preterm AB Living  0 0 0 0 0 0  SAB IAB Ectopic Multiple Live Births  0 0 0 0     Past Medical History:  Diagnosis Date    Anxiety    Depression    Dizziness    GERD (gastroesophageal reflux disease)    Migraine    with aura around menses   Ovarian cancer (HCC) 2008   Stage 1A    Past Surgical History:  Procedure Laterality Date   APPENDECTOMY  08/2006   BUNIONECTOMY  02/2007   CYSTECTOMY  07/2008   from right ovary   ROBOTIC ASSISTED LAPAROSCOPIC OVARIAN CYSTECTOMY Right 06/14/2015   Procedure: ROBOTIC LAPAROSCOPIC RIGHT OVARIAN CYSTECTOMY/;  Surgeon: Alphonso Aschoff, MD;  Location: [facility];  Service: Gynecology;  Laterality: Right;   SALPINGOOPHORECTOMY  07/2006   Left for Stage 1A ovarian cancer   WISDOM TOOTH EXTRACTION  2015    Current Outpatient Medications on File Prior to Visit  Medication Sig Dispense Refill   acetaminophen  (TYLENOL ) 500 MG tablet Take 500 mg by mouth every 6 (six) hours as needed.     ALPRAZolam  (XANAX ) 0.25 MG tablet Take 1-2 tablets (0.25-0.5 mg total) by mouth 2 (two) times daily as needed for anxiety. 60 tablet 2   BIOTIN PO Take by mouth.     buPROPion  (WELLBUTRIN  XL) 300 MG 24 hr tablet Take 1 tablet (300 mg total) by mouth daily. 90 tablet 1   Calcium Carbonate (CALCIUM 500 PO) Take by mouth.     cetirizine (ZYRTEC) 10 MG chewable tablet Chew 10 mg by mouth daily.     Cyanocobalamin (B-12) 2500 MCG SUBL Place 1 drop under the tongue daily.     Ergocalciferol  (VITAMIN D2) 50 MCG (2000 UT) TABS Take 1 tablet by mouth daily.     MAGNESIUM PO Take by  mouth.     No current facility-administered medications on file prior to visit.    Social History   Socioeconomic History   Marital status: Single    Spouse name: Not on file   Number of children: Not on file   Years of education: Not on file   Highest education level: Master's degree (e.g., MA, MS, MEng, MEd, MSW, MBA)  Occupational History   Not on file  Tobacco Use   Smoking status: Never   Smokeless tobacco: Never  Vaping Use   Vaping status: Never Used  Substance and Sexual Activity   Alcohol use: Yes    Comment:  1-2 per month   Drug use: No   Sexual activity: Not Currently    Partners: Male    Birth control/protection: Abstinence  Other Topics Concern   Not on file  Social History Narrative   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[employer]   0 children   Exercise-treadmill, weights 1-2 times per week   Social Drivers of Corporate investment banker Strain: Not on file  Food Insecurity: Not on file  Transportation Needs: Not on file  Physical Activity: Not on file  Stress: Not on file  Social Connections: Not on file  Intimate Partner Violence: Not on file    Family History  Problem Relation Age of Onset   Fibromyalgia Mother    Thyroid disease Mother    Blindness Father    Alcohol abuse Father    Diabetes Maternal Aunt    Heart attack Maternal Grandfather    Heart disease Maternal Grandfather        Heart attack; CAD    Dementia Maternal Grandmother     Allergies  Allergen Reactions   Amoxicillin     Has patient had a PCN reaction causing immediate rash, facial/tongue/throat swelling, SOB or lightheadedness with hypotension: No Has patient had a PCN reaction causing severe rash involving mucus membranes or skin necrosis: No Has patient had a PCN reaction that required hospitalization No Has patient had a PCN reaction occurring within the last 10 years:NO If all of the above answers are "NO", then may proceed with Cephalosporin use. Leg cramps and fever  Confirmed NO to all questions 10/09/2022   Aspirin     Throat gets itchy   Nsaids Itching    Itchy throat and gums, throat closes up; palpitations    Trintellix  [Vortioxetine ] Other (See Comments)    Severe constipation      PE Today's Vitals   03/25/23 1445  BP: 124/72  Pulse: 100  SpO2: 99%  Weight: 122 lb (55.3 kg)  Height: 5' (1.524 m)   Body mass index is 23.83 kg/m.  Physical Exam Vitals reviewed. Exam conducted with a chaperone present.  Constitutional:      General: She is not in acute distress.     Appearance: Normal appearance.  HENT:     Head: Normocephalic and atraumatic.     Nose: Nose normal.  Eyes:     Extraocular Movements: Extraocular movements intact.     Conjunctiva/sclera: Conjunctivae normal.  Neck:     Thyroid: No thyroid mass, thyromegaly or thyroid tenderness.  Pulmonary:     Effort: Pulmonary effort is normal.  Chest:     Chest wall: No mass or tenderness.  Breasts:    Right: Normal. No swelling, mass, nipple discharge, skin change or tenderness.     Left: Normal. No swelling, mass, nipple discharge, skin change or tenderness.  Abdominal:  General: There is no distension.     Palpations: Abdomen is soft.     Tenderness: There is no abdominal tenderness.  Genitourinary:    General: Normal vulva.     Exam position: Lithotomy position.     Urethra: No prolapse.     Vagina: Normal. No vaginal discharge or bleeding.     Cervix: Normal. No lesion.     Uterus: Normal. Not enlarged and not tender.      Adnexa: Right adnexa normal and left adnexa normal.  Musculoskeletal:        General: Normal range of motion.     Cervical back: Normal range of motion.  Lymphadenopathy:     Upper Body:     Right upper body: No axillary adenopathy.     Left upper body: No axillary adenopathy.     Lower Body: No right inguinal adenopathy. No left inguinal adenopathy.  Skin:    General: Skin is warm and dry.  Neurological:     General: No focal deficit present.     Mental Status: She is alert.  Psychiatric:        Mood and Affect: Mood normal.        Behavior: Behavior normal.       Assessment and Plan:        Well woman exam with routine gynecological exam Assessment & Plan: Reviewed menstrual changes are likely due to diminished ovarian function from prior surgeries. Cervical cancer screening performed according to ASCCP guidelines. Encouraged annual mammogram screening Labs and immunizations with her primary Encouraged safe sexual practices as  indicated Encouraged healthy lifestyle practices with diet and exercise For patients under 50yo, I recommend 1000mg  calcium daily and 600IU of vitamin D  daily.   History of ovarian cancer -     CA 125  Vaginal discharge -     WET PREP FOR TRICH, YEAST, CLUE  Menstrual abnormality -     FSH/LH -     Estradiol  Will check for POI.  Romaine Closs, MD

## 2023-03-25 NOTE — Addendum Note (Signed)
 Addended by: Romaine Closs on: 03/25/2023 04:49 PM   Modules accepted: Orders

## 2023-03-25 NOTE — Assessment & Plan Note (Addendum)
 Reviewed menstrual changes are likely due to diminished ovarian function from prior surgeries. Cervical cancer screening performed according to ASCCP guidelines. Encouraged annual mammogram screening Labs and immunizations with her primary Encouraged safe sexual practices as indicated Encouraged healthy lifestyle practices with diet and exercise For patients under 41yo, I recommend 1000mg  calcium daily and 600IU of vitamin D  daily.

## 2023-03-26 LAB — FSH/LH
FSH: 3.5 m[IU]/mL
LH: 2.6 m[IU]/mL

## 2023-03-26 LAB — ESTRADIOL: Estradiol: 170 pg/mL

## 2023-03-26 LAB — CA 125: CA 125: 54 U/mL — ABNORMAL HIGH (ref <35)

## 2023-03-27 ENCOUNTER — Encounter: Payer: Self-pay | Admitting: Obstetrics and Gynecology

## 2023-03-30 ENCOUNTER — Ambulatory Visit: Payer: 59 | Admitting: Physician Assistant

## 2023-03-30 ENCOUNTER — Encounter: Payer: Self-pay | Admitting: Physician Assistant

## 2023-03-30 DIAGNOSIS — F331 Major depressive disorder, recurrent, moderate: Secondary | ICD-10-CM

## 2023-03-30 DIAGNOSIS — F411 Generalized anxiety disorder: Secondary | ICD-10-CM

## 2023-03-30 MED ORDER — ALPRAZOLAM 0.25 MG PO TABS: 0.2500 mg | ORAL_TABLET | Freq: Two times a day (BID) | ORAL | 2 refills | Status: DC | PRN

## 2023-03-30 MED ORDER — BUPROPION HCL ER (XL) 300 MG PO TB24: 300.0000 mg | ORAL_TABLET | Freq: Every day | ORAL | 1 refills | Status: DC

## 2023-03-30 MED ORDER — GABAPENTIN 100 MG PO CAPS: ORAL_CAPSULE | ORAL | 0 refills | Status: DC

## 2023-03-30 NOTE — Progress Notes (Signed)
[facility]  Patient ID: Sharon Holmes,  MRN: [id number]  PCP: Avanell Shackleton, NP-C  Date of Evaluation: 03/30/2023 Time spent:20 minutes  Chief Complaint:  Chief Complaint   Anxiety; Depression; Follow-up    HISTORY/CURRENT STATUS: HPI  For routine med check  Anxiety is worse. Goes to the 'most catastrophic scenario possible.' Her brain won't shut up. Gets triggered about things. Goes down a rabbit whole, thinking she has bird flu after working with her bird feeder. Had a sore throat, but then she'd been in [place] airport a few days before that, 'it's not logical.' Doesn't obsess for long. She felt better the next day. 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 Uses tv or video games as a distraction. Xanax helps but she doesn't want to take it everyday. No known trigger. Hard time going to sleep, might take it once a week or so.  Started journaling. Trouble going to sleep a lot of the time b/c of random thoughts that she can't stop.  Xanax helps her fall asleep when it's absolutely needed.   Patient is able to enjoy things.  Energy and motivation are good.  Work has been hard, 'a terrible year.'  Nothing that directly affects her, but everything ends up affecting her b/c of the way things end up being an issue with her kids.  No extreme sadness, tearfulness, or feelings of hopelessness.  ADLs and personal hygiene are normal.   Denies any changes in concentration, making decisions, or remembering things.  Appetite has not changed.  Weight is stable.  Denies suicidal or homicidal thoughts.  Patient denies increased energy with decreased need for sleep, increased talkativeness, racing thoughts, impulsivity or risky behaviors, increased spending, increased libido, grandiosity, increased irritability or anger, paranoia, or hallucinations.  Denies dizziness, syncope, seizures, numbness, tingling, tremor, tics, unsteady gait, slurred speech,  confusion. Denies muscle or joint pain, stiffness, or dystonia.  Individual Medical History/ Review of Systems: Changes? :No     Past medications for mental health diagnoses include: Prozac, Trintellix caused severe constipation, Cymbalta, Effexor, Lexapro, Xanax  Allergies: Amoxicillin, Aspirin, Nsaids, and Trintellix [vortioxetine]  Current Medications:  Current Outpatient Medications:    acetaminophen (TYLENOL) 500 MG tablet, Take 500 mg by mouth every 6 (six) hours as needed., Disp: , Rfl:    BIOTIN PO, Take by mouth., Disp: , Rfl:    Calcium Carbonate (CALCIUM 500 PO), Take by mouth., Disp: , Rfl:    cetirizine (ZYRTEC) 10 MG chewable tablet, Chew 10 mg by mouth daily., Disp: , Rfl:    Cyanocobalamin (B-12) 2500 MCG SUBL, Place 1 drop under the tongue daily., Disp: , Rfl:    DHA-EPA-Vitamin E (OMEGA-3 COMPLEX PO), Take by mouth., Disp: , Rfl:    Ergocalciferol (VITAMIN D2) 50 MCG (2000 UT) TABS, Take 1 tablet by mouth daily., Disp: , Rfl:    gabapentin (NEURONTIN) 100 MG capsule, 1-3 po at bedtime prn., Disp: 90 capsule, Rfl: 0   MAGNESIUM PO, Take by mouth., Disp: , Rfl:    ALPRAZolam (XANAX) 0.25 MG tablet, Take 1-2 tablets (0.25-0.5 mg total) by mouth 2 (two) times daily as needed for anxiety., Disp: 60 tablet, Rfl: 2   buPROPion (WELLBUTRIN XL) 300 MG 24 hr tablet, Take 1 tablet (300 mg total) by mouth daily., Disp: 90 tablet, Rfl: 1 Medication Side Effects: none  Family Medical/ Social History: Changes? None  MENTAL HEALTH EXAM:  Last menstrual period 03/05/2023.There is no height or  weight on file to calculate BMI.  General Appearance: Casual and Well Groomed  Eye Contact:  Good  Speech:  Clear and Coherent and Normal Rate  Volume:  Normal  Mood:  Anxious  Affect:  Congruent  Thought Process:  Goal Directed and Descriptions of Associations: Circumstantial  Orientation:  Full (Time, Place, and Person)  Thought Content: Illogical   Suicidal Thoughts:  No  Homicidal  Thoughts:  No  Memory:  WNL  Judgement:  Good  Insight:  Good  Psychomotor Activity:  Normal  Concentration:  Concentration: Good and Attention Span: Good  Recall:  Good  Fund of Knowledge: Good  Language: Good  Assets:  Desire for Improvement Financial Resources/Insurance Housing Transportation Vocational/Educational  ADL's:  Intact  Cognition: WNL  Prognosis:  Good   DIAGNOSES:    ICD-10-CM   1. Generalized anxiety disorder  F41.1 buPROPion (WELLBUTRIN XL) 300 MG 24 hr tablet    2. Major depressive disorder, recurrent episode, moderate (HCC)  F33.1 buPROPion (WELLBUTRIN XL) 300 MG 24 hr tablet     Receiving Psychotherapy: No   RECOMMENDATIONS:  PDMP reviewed.  Xanax filled 03/31/2022. I provided 20 minutes of face to face time during this encounter, including time spent before and after the visit in records review, medical decision making, counseling pertinent to today's visit, and charting.   Disc the anxiety. She'll see her counselor in a few days.  We discussed BuSpar or gabapentin.  She prefers not to add another medication that she has to take daily.  The gabapentin could be taken as needed.  She seems very sensitive to medications so I will give a low dose and then she can control the amount she takes.  Benefits, risk and side effects were discussed and she accepts.  Continue Xanax 0.25 mg, 1-2 p.o. twice daily as needed anxiety. Continue Wellbutrin XL 300 mg, 1 p.o. daily. Start gabapentin 100 mg, 1-3 p.o. nightly as needed. Continue vitamins as listed on med list. She is starting counseling again later on this week. Return in 6 weeks.  Melony Overly, PA-C

## 2023-04-03 ENCOUNTER — Ambulatory Visit
Admission: RE | Admit: 2023-04-03 | Discharge: 2023-04-03 | Disposition: A | Discharge status: Home / Self Care | Payer: BC Managed Care – PPO | Source: Ambulatory Visit | Attending: Radiology | Admitting: Radiology

## 2023-04-03 ENCOUNTER — Ambulatory Visit
Admission: RE | Admit: 2023-04-03 | Discharge: 2023-04-03 | Disposition: A | Discharge status: Home / Self Care | Payer: 59 | Source: Ambulatory Visit | Attending: Radiology | Admitting: Radiology

## 2023-04-03 DIAGNOSIS — R928 Other abnormal and inconclusive findings on diagnostic imaging of breast: Secondary | ICD-10-CM

## 2023-04-06 ENCOUNTER — Other Ambulatory Visit: Payer: Self-pay | Admitting: Radiology

## 2023-04-06 DIAGNOSIS — N6310 Unspecified lump in the right breast, unspecified quadrant: Secondary | ICD-10-CM

## 2023-05-12 ENCOUNTER — Ambulatory Visit (INDEPENDENT_AMBULATORY_CARE_PROVIDER_SITE_OTHER): Payer: Self-pay | Admitting: Physician Assistant

## 2023-05-12 ENCOUNTER — Encounter: Payer: Self-pay | Admitting: Physician Assistant

## 2023-05-12 DIAGNOSIS — F3342 Major depressive disorder, recurrent, in full remission: Secondary | ICD-10-CM | POA: Diagnosis not present

## 2023-05-12 DIAGNOSIS — F411 Generalized anxiety disorder: Secondary | ICD-10-CM

## 2023-05-12 NOTE — Progress Notes (Unsigned)
 [facility]  Patient ID: Sharon Holmes,  MRN: [id number]  PCP: Avanell Shackleton, NP-C  Date of Evaluation: 05/12/2023 Time spent:20 minutes  Chief Complaint:  Chief Complaint   Anxiety; Depression; Follow-up    HISTORY/CURRENT STATUS: HPI  For routine med check  Doing well with current meds.  She was nervous about the possible SE of Gabapentin so she didn't start it.  The anxiety has improved.  She is seeing her counselor again which has been helpful too.  She is not having panic attacks but gets overwhelmed easily.  The Xanax is helpful when needed.  Patient is able to enjoy things.  Energy and motivation are good.  Work is going well.   No extreme sadness, tearfulness, or feelings of hopelessness.  Sleeps well most of the time. ADLs and personal hygiene are normal.   Denies any changes in concentration, making decisions, or remembering things.  Appetite has not changed.  Weight is stable.  Denies suicidal or homicidal thoughts.  Patient denies increased energy with decreased need for sleep, increased talkativeness, racing thoughts, impulsivity or risky behaviors, increased spending, increased libido, grandiosity, increased irritability or anger, paranoia, or hallucinations.  Denies dizziness, syncope, seizures, numbness, tingling, tremor, tics, unsteady gait, slurred speech, confusion. Denies muscle or joint pain, stiffness, or dystonia.  Individual Medical History/ Review of Systems: Changes? :No     Past medications for mental health diagnoses include: Prozac, Trintellix caused severe constipation, Cymbalta, Effexor, Lexapro, Xanax  Allergies: Amoxicillin, Aspirin, Nsaids, and Trintellix [vortioxetine]  Current Medications:  Current Outpatient Medications:    acetaminophen (TYLENOL) 500 MG tablet, Take 500 mg by mouth every 6 (six) hours as needed., Disp: , Rfl:    ALPRAZolam (XANAX) 0.25 MG tablet, Take 1-2 tablets (0.25-0.5 mg total) by mouth 2 (two) times daily as  needed for anxiety., Disp: 60 tablet, Rfl: 2   BIOTIN PO, Take by mouth., Disp: , Rfl:    buPROPion (WELLBUTRIN XL) 300 MG 24 hr tablet, Take 1 tablet (300 mg total) by mouth daily., Disp: 90 tablet, Rfl: 1   Calcium Carbonate (CALCIUM 500 PO), Take by mouth., Disp: , Rfl:    Cyanocobalamin (B-12) 2500 MCG SUBL, Place 1 drop under the tongue daily., Disp: , Rfl:    DHA-EPA-Vitamin E (OMEGA-3 COMPLEX PO), Take by mouth., Disp: , Rfl:    Ergocalciferol (VITAMIN D2) 50 MCG (2000 UT) TABS, Take 1 tablet by mouth daily., Disp: , Rfl:    MAGNESIUM PO, Take by mouth., Disp: , Rfl:    cetirizine (ZYRTEC) 10 MG chewable tablet, Chew 10 mg by mouth daily., Disp: , Rfl:  Medication Side Effects: none  Family Medical/ Social History: Changes? None  MENTAL HEALTH EXAM:  There were no vitals taken for this visit.There is no height or weight on file to calculate BMI.  General Appearance: Casual and Well Groomed  Eye Contact:  Good  Speech:  Clear and Coherent and Normal Rate  Volume:  Normal  Mood:  Euthymic  Affect:  Congruent  Thought Process:  Goal Directed and Descriptions of Associations: Circumstantial  Orientation:  Full (Time, Place, and Person)  Thought Content: Illogical   Suicidal Thoughts:  No  Homicidal Thoughts:  No  Memory:  WNL  Judgement:  Good  Insight:  Good  Psychomotor Activity:  Normal  Concentration:  Concentration: Good and Attention Span: Good  Recall:  Good  Fund of Knowledge: Good  Language: Good  Assets:  Desire for Improvement Financial Resources/Insurance Housing Transportation Vocational/Educational  ADL's:  Intact  Cognition: WNL  Prognosis:  Good   DIAGNOSES:    ICD-10-CM   1. Generalized anxiety disorder  F41.1     2. Recurrent major depressive disorder, in full remission (HCC)  F33.42       Receiving Psychotherapy: Yes  Matt Sixberry  RECOMMENDATIONS:  PDMP reviewed.  Xanax filled 03/30/2023.  Gabapentin filled 03/30/2023. I provided 20  minutes of face to face time during this encounter, including time spent before and after the visit in records review, medical decision making, counseling pertinent to today's visit, and charting.   Jaz is doing well overall so no changes need to be made.  Continue Xanax 0.25 mg, 1-2 p.o. twice daily as needed anxiety. Continue Wellbutrin XL 300 mg, 1 p.o. daily. Continue vitamins as listed on med list. Continue counseling with [facility] Return in 6 months.   Melony Overly, PA-C

## 2023-09-24 ENCOUNTER — Ambulatory Visit: Payer: Self-pay

## 2023-09-24 NOTE — Telephone Encounter (Signed)
 FYI Only or Action Required?: FYI only for provider.  Patient was last seen in primary care on 02/19/2023 by Lendia Boby CROME, NP-C.  Called Nurse Triage reporting Ear Fullness and Dizziness.  Symptoms began several days ago.  Interventions attempted: Nothing.  Symptoms are: gradually worsening.  Triage Disposition: See PCP When Office is Open (Within 3 Days)  Patient/caregiver understands and will follow disposition?: Yes                             Copied from CRM [id number]. Topic: Clinical - Red Word Triage >> Sep 24, 2023 12:26 PM Jayma L wrote: Red Word that prompted transfer to Nurse Triage: patient called in and stated she has her right side ear clogged and she's dizzy or light headed and this has been going on 2-3 days , Reason for Disposition  [1] Ear congestion lasts > 3 days AND [2] no improvement after using Care Advice  (Exception: Ear congestion is a chronic symptom.)  Answer Assessment - Initial Assessment Questions 1. LOCATION: Which ear is involved?       Right ear 2. SENSATION: Describe how the ear feels. (e.g., stuffy, full, plugged).      It feels like there is stuff in there, I can hear stuff in there 3. ONSET:  When did the ear symptoms start?       2-3 days 4. PAIN: Do you also have an earache? If Yes, ask: How bad is it? (Scale 0-10; none, mild, moderate or severe)     Denies 5. CAUSE: What do you think is causing the ear congestion? (e.g., common cold, nasal allergies, recent flight, recent snorkeling)     States this has happened before and she has had to have her ear cleaned out 6. OTHER SYMPTOMS: Do you have any other symptoms? (e.g., ear drainage, hay fever symptoms such as sneezing or a clear nasal discharge; cold symptoms such as a cough or runny nose)     Dizziness/lightheadedness, denies falls/fainting, denies sore throat, denied headache, denies runny nose  Protocols used: Ear - Congestion-A-AH

## 2023-09-25 ENCOUNTER — Ambulatory Visit: Admitting: Family Medicine

## 2023-09-25 ENCOUNTER — Encounter: Payer: Self-pay | Admitting: Family Medicine

## 2023-09-25 VITALS — BP 100/70 | HR 82 | Temp 97.6°F | Ht 60.0 in | Wt 123.0 lb

## 2023-09-25 DIAGNOSIS — H6692 Otitis media, unspecified, left ear: Secondary | ICD-10-CM | POA: Diagnosis not present

## 2023-09-25 DIAGNOSIS — H61321 Acquired stenosis of right external ear canal secondary to inflammation and infection: Secondary | ICD-10-CM | POA: Diagnosis not present

## 2023-09-25 DIAGNOSIS — H6121 Impacted cerumen, right ear: Secondary | ICD-10-CM | POA: Diagnosis not present

## 2023-09-25 DIAGNOSIS — H6122 Impacted cerumen, left ear: Secondary | ICD-10-CM

## 2023-09-25 MED ORDER — AZITHROMYCIN 250 MG PO TABS
ORAL_TABLET | ORAL | 0 refills | Status: AC
Start: 2023-09-25 — End: 2023-09-30

## 2023-09-25 MED ORDER — NEOMYCIN-POLYMYXIN-HC 3.5-10000-1 OT SOLN: 4.0000 [drp] | Freq: Four times a day (QID) | OTIC | 0 refills | Status: DC

## 2023-09-25 NOTE — Progress Notes (Signed)
 Subjective:  Sharon Holmes is a 43 y.o. female who presents for a 5 day hx of right ear fullness and decreased hearing. Hx of cerumen impaction. Denies any other URI symptoms.  She uses Q-tips some days.  No fever, chills, headache, nasal congestion, sore throat, cough.  ROS as in subjective.   Objective: Vitals:   09/25/23 0810  BP: 100/70  Pulse: 82  Temp: 97.6 F (36.4 C)  SpO2: 98%    General appearance: Alert, WD/WN, no distress                             Skin: warm, no rash                           Head: no sinus tenderness                            Eyes: conjunctiva normal, corneas clear, PERRLA                            Ears: pearly TMs, external ear canals normal                          Nose: septum midline, no discharge             Mouth/throat: MMM                           Neck: supple, normal ROM                          Heart: RRR                         Lungs: CTA, unlabored       Assessment: Acute otitis media, left  Hearing loss of right ear due to cerumen impaction  Excessive cerumen in left ear canal  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  Following ear lavage, left ear canal and TM normal. Right ear canal with inflammation and swelling and right TM dull and retracted.  Allergy to Amoxicillin. Offered Z-pack vs Doxycycline .  Z-pak ordered along with Cortisporin otic. Use Tylenol  prn pain.  Follow up if worsening or not improving in the next 3-4 days.  Recommend against using Q-tips or putting anything into her ears.  Follow-up as needed.

## 2023-09-30 ENCOUNTER — Other Ambulatory Visit

## 2023-09-30 ENCOUNTER — Encounter

## 2023-10-03 ENCOUNTER — Other Ambulatory Visit: Payer: Self-pay | Admitting: Physician Assistant

## 2023-10-03 DIAGNOSIS — F331 Major depressive disorder, recurrent, moderate: Secondary | ICD-10-CM

## 2023-10-03 DIAGNOSIS — F411 Generalized anxiety disorder: Secondary | ICD-10-CM

## 2023-10-07 ENCOUNTER — Ambulatory Visit
Admission: RE | Admit: 2023-10-07 | Discharge: 2023-10-07 | Disposition: A | Discharge status: Home / Self Care | Source: Ambulatory Visit | Attending: Radiology | Admitting: Radiology

## 2023-10-07 DIAGNOSIS — N6310 Unspecified lump in the right breast, unspecified quadrant: Secondary | ICD-10-CM

## 2023-10-11 ENCOUNTER — Ambulatory Visit (HOSPITAL_COMMUNITY): Payer: Self-pay

## 2023-11-12 ENCOUNTER — Ambulatory Visit: Admitting: Physician Assistant

## 2023-11-12 NOTE — Progress Notes (Unsigned)
 [facility]  Patient ID: Sharon Holmes,  MRN: [id number]  PCP: Lendia Boby CROME, NP-C  Date of Evaluation: 11/12/2023 Time spent:20 minutes  Chief Complaint:    HISTORY/CURRENT STATUS: HPI  For routine med check  Doing well with current meds.  She was nervous about the possible SE of Gabapentin  so she didn't start it.  The anxiety has improved.  She is seeing her counselor again which has been helpful too.  She is not having panic attacks but gets overwhelmed easily.  The Xanax  is helpful when needed.  Patient is able to enjoy things.  Energy and motivation are good.  Work is going well.   No extreme sadness, tearfulness, or feelings of hopelessness.  Sleeps well most of the time. ADLs and personal hygiene are normal.   Denies any changes in concentration, making decisions, or remembering things.  Appetite has not changed.  Weight is stable.  Denies suicidal or homicidal thoughts.  Patient denies increased energy with decreased need for sleep, increased talkativeness, racing thoughts, impulsivity or risky behaviors, increased spending, increased libido, grandiosity, increased irritability or anger, paranoia, or hallucinations.  Denies dizziness, syncope, seizures, numbness, tingling, tremor, tics, unsteady gait, slurred speech, confusion. Denies muscle or joint pain, stiffness, or dystonia.  Individual Medical History/ Review of Systems: Changes? :No     Past medications for mental health diagnoses include: Prozac, Trintellix  caused severe constipation, Cymbalta, Effexor, Lexapro, Xanax   Allergies: Amoxicillin, Aspirin, Nsaids, and Trintellix  [vortioxetine ]  Current Medications:  Current Outpatient Medications:  .  acetaminophen  (TYLENOL ) 500 MG tablet, Take 500 mg by mouth every 6 (six) hours as needed., Disp: , Rfl:  .  ALPRAZolam  (XANAX ) 0.25 MG tablet, Take 1-2 tablets (0.25-0.5 mg total) by mouth 2 (two) times daily as needed for anxiety., Disp: 60 tablet, Rfl: 2 .   BIOTIN PO, Take by mouth., Disp: , Rfl:  .  buPROPion  (WELLBUTRIN  XL) 300 MG 24 hr tablet, TAKE 1 TABLET BY MOUTH EVERY DAY, Disp: 90 tablet, Rfl: 0 .  Calcium Carbonate (CALCIUM 500 PO), Take by mouth., Disp: , Rfl:  .  cetirizine (ZYRTEC) 10 MG chewable tablet, Chew 10 mg by mouth daily., Disp: , Rfl:  .  Cyanocobalamin (B-12) 2500 MCG SUBL, Place 1 drop under the tongue daily., Disp: , Rfl:  .  DHA-EPA-Vitamin E (OMEGA-3 COMPLEX PO), Take by mouth., Disp: , Rfl:  .  Ergocalciferol  (VITAMIN D2) 50 MCG (2000 UT) TABS, Take 1 tablet by mouth daily., Disp: , Rfl:  .  MAGNESIUM PO, Take by mouth., Disp: , Rfl:  .  neomycin -polymyxin-hydrocortisone (CORTISPORIN) OTIC solution, Place 4 drops into the right ear 4 (four) times daily., Disp: 10 mL, Rfl: 0 Medication Side Effects: none  Family Medical/ Social History: Changes? None  MENTAL HEALTH EXAM:  There were no vitals taken for this visit.There is no height or weight on file to calculate BMI.  General Appearance: Casual and Well Groomed  Eye Contact:  Good  Speech:  Clear and Coherent and Normal Rate  Volume:  Normal  Mood:  Euthymic  Affect:  Congruent  Thought Process:  Goal Directed and Descriptions of Associations: Circumstantial  Orientation:  Full (Time, Place, and Person)  Thought Content: Illogical   Suicidal Thoughts:  No  Homicidal Thoughts:  No  Memory:  WNL  Judgement:  Good  Insight:  Good  Psychomotor Activity:  Normal  Concentration:  Concentration: Good and Attention Span: Good  Recall:  Good  Fund of Knowledge: Good  Language: Good  Assets:  Desire for Improvement Financial Resources/Insurance Housing Transportation Vocational/Educational  ADL's:  Intact  Cognition: WNL  Prognosis:  Good   DIAGNOSES:  No diagnosis found.   Receiving Psychotherapy: Yes  Matt Sixberry  RECOMMENDATIONS:  PDMP reviewed.  Xanax  filled 03/30/2023.  Gabapentin  filled 03/30/2023. I provided 20 minutes of face to face time  during this encounter, including time spent before and after the visit in records review, medical decision making, counseling pertinent to today's visit, and charting.   [name] is doing well overall so no changes need to be made.  Continue Xanax  0.25 mg, 1-2 p.o. twice daily as needed anxiety. Continue Wellbutrin  XL 300 mg, 1 p.o. daily. Continue vitamins as listed on med list. Continue counseling with [facility]. Return in 6 months.   Verneita Cooks, PA-C

## 2024-01-03 ENCOUNTER — Other Ambulatory Visit: Payer: Self-pay | Admitting: Physician Assistant

## 2024-01-03 DIAGNOSIS — F411 Generalized anxiety disorder: Secondary | ICD-10-CM

## 2024-01-03 DIAGNOSIS — F331 Major depressive disorder, recurrent, moderate: Secondary | ICD-10-CM

## 2024-01-03 NOTE — Telephone Encounter (Signed)
 Please call to schedule FU

## 2024-01-04 NOTE — Telephone Encounter (Signed)
 Appt 11/24. Running low on Buproprione

## 2024-01-08 ENCOUNTER — Encounter: Payer: Self-pay | Admitting: Family Medicine

## 2024-01-08 ENCOUNTER — Ambulatory Visit: Admitting: Family Medicine

## 2024-01-08 ENCOUNTER — Ambulatory Visit: Payer: Self-pay

## 2024-01-08 VITALS — BP 100/70 | HR 90 | Temp 98.2°F | Ht 60.0 in | Wt 129.0 lb

## 2024-01-08 DIAGNOSIS — R42 Dizziness and giddiness: Secondary | ICD-10-CM | POA: Diagnosis not present

## 2024-01-08 DIAGNOSIS — H938X1 Other specified disorders of right ear: Secondary | ICD-10-CM

## 2024-01-08 NOTE — Telephone Encounter (Signed)
 FYI Only or Action Required?: FYI only for provider: appointment scheduled on today.  Patient was last seen in primary care on 09/25/2023 by Lendia Boby CROME, NP-C.  Called Nurse Triage reporting Dizziness.  Symptoms began several days ago.  Interventions attempted: Rest, hydration, or home remedies.  Symptoms are: unchanged.  Triage Disposition: See Physician Within 24 Hours  Patient/caregiver understands and will follow disposition?: Yes     Copied from CRM [id number]. Topic: Clinical - Red Word Triage >> Jan 08, 2024  1:00 PM Alfonso HERO wrote: Red Word that prompted transfer to Nurse Triage: dizzy, lightheaded      Reason for Disposition  [1] MODERATE dizziness (e.g., vertigo; feels very unsteady, interferes with normal activities) AND [2] has NOT been evaluated by doctor (or NP/PA) for this  Answer Assessment - Initial Assessment Questions 1. DESCRIPTION: Describe your dizziness.     Lightheaded and vertigo  2. LIGHTHEADED: Do you feel lightheaded? (e.g., somewhat faint, woozy, weak upon standing)     Yes 3. VERTIGO: Do you feel like either you or the room is spinning or tilting? (i.e., vertigo)     Yes 4. SEVERITY: How bad is it?  Do you feel like you are going to faint? Can you stand and walk?     Moderate  5. ONSET:  When did the dizziness begin?     A few days ago  6. AGGRAVATING FACTORS: Does anything make it worse? (e.g., standing, change in head position)     No 7. HEART RATE: Can you tell me your heart rate? How many beats in 15 seconds?  (Note: Not all patients can do this.)       No 8. CAUSE: What do you think is causing the dizziness? (e.g., decreased fluids or food, diarrhea, emotional distress, heat exposure, new medicine, sudden standing, vomiting; unknown)     Unsure if due to an ear infection  9. RECURRENT SYMPTOM: Have you had dizziness before? If Yes, ask: When was the last time? What happened that time?     Has had  dizziness before  10. OTHER SYMPTOMS: Do you have any other symptoms? (e.g., fever, chest pain, vomiting, diarrhea, bleeding)       Fullness and drainage in her left ear  Protocols used: Dizziness - Vertigo-A-AH

## 2024-01-08 NOTE — Progress Notes (Signed)
 Established Patient Office Visit  Subjective   Patient ID: Sharon Holmes, female    DOB: April 06, 1980  Age: 43 y.o. MRN: [id number]  Chief Complaint  Patient presents with   Dizziness    HPI   T[place] is seen with some dizziness and right ear fullness for the past few days.  This past Tuesday she noticed some right ear fullness.  She had a little bit of nausea and very transient vertigo type symptoms.  No nasal congestion.  No acute hearing changes.  Had somewhat similar symptoms last August and apparently they were unable to get a good look at her eardrum and they tried ear irrigation but had to abort that secondary to pain.  She was treated empirically with Zithromax .  Denies any recent flying.  No speech changes.  No focal weakness.  No ataxia.  Past Medical History:  Diagnosis Date   Anxiety    Depression    Dizziness    GERD (gastroesophageal reflux disease)    Migraine    with aura around menses   Ovarian cancer (HCC) 2008   Stage 1A   Past Surgical History:  Procedure Laterality Date   APPENDECTOMY  08/2006   BUNIONECTOMY  02/2007   CYSTECTOMY  07/2008   from right ovary   ROBOTIC ASSISTED LAPAROSCOPIC OVARIAN CYSTECTOMY Right 06/14/2015   Procedure: ROBOTIC LAPAROSCOPIC RIGHT OVARIAN CYSTECTOMY/;  Surgeon: Maurilio Ship, MD;  Location: [facility];  Service: Gynecology;  Laterality: Right;   SALPINGOOPHORECTOMY  07/2006   Left for Stage 1A ovarian cancer   WISDOM TOOTH EXTRACTION  2015    reports that she has never smoked. She has never used smokeless tobacco. She reports current alcohol use. She reports that she does not use drugs. family history includes Alcohol abuse in her father; Blindness in her father; Dementia in her maternal grandmother; Diabetes in her maternal aunt; Fibromyalgia in her mother; Heart attack in her maternal grandfather; Heart disease in her maternal grandfather; Thyroid disease in her mother. Allergies  Allergen Reactions   Amoxicillin     Has patient had  a PCN reaction causing immediate rash, facial/tongue/throat swelling, SOB or lightheadedness with hypotension: No Has patient had a PCN reaction causing severe rash involving mucus membranes or skin necrosis: No Has patient had a PCN reaction that required hospitalization No Has patient had a PCN reaction occurring within the last 10 years:NO If all of the above answers are NO, then may proceed with Cephalosporin use. Leg cramps and fever  Confirmed NO to all questions 10/09/2022   Aspirin     Throat gets itchy   Nsaids Itching    Itchy throat and gums, throat closes up; palpitations    Trintellix  [Vortioxetine ] Other (See Comments)    Severe constipation    Review of Systems  Constitutional:  Negative for chills and fever.  HENT:  Negative for ear discharge, hearing loss and sinus pain.   Respiratory:  Negative for cough.   Neurological:  Positive for dizziness. Negative for speech change, focal weakness and headaches.      Objective:     BP 100/70   Pulse 90   Temp 98.2 F (36.8 C) (Oral)   Ht 5' (1.524 m)   Wt 129 lb (58.5 kg)   LMP 01/01/2024 (Approximate)   SpO2 98%   BMI 25.19 kg/m  BP Readings from Last 3 Encounters:  01/08/24 100/70  09/25/23 100/70  03/25/23 124/72   Wt Readings from Last 3 Encounters:  01/08/24 129 lb (  58.5 kg)  09/25/23 123 lb (55.8 kg)  03/25/23 122 lb (55.3 kg)      Physical Exam Vitals reviewed.  Constitutional:      General: She is not in acute distress.    Appearance: She is not ill-appearing.  HENT:     Ears:     Comments: Left ear canal and eardrum appear normal.  Right canal reveals minimal cerumen.  This was easily removed with curette without difficulty.  Eardrum appears normal.  No visible effusion.  No erythema.  Normal landmarks. Cardiovascular:     Rate and Rhythm: Normal rate and regular rhythm.  Pulmonary:     Effort: Pulmonary effort is normal.     Breath sounds: Normal breath sounds.  Musculoskeletal:      Cervical back: Neck supple.  Neurological:     General: No focal deficit present.     Mental Status: She is alert and oriented to person, place, and time.     Cranial Nerves: No cranial nerve deficit.     Coordination: Coordination normal.     Comments: No ataxia.  No focal weakness.  Gait normal.  No reproducible vertigo with changing positions from seated to supine with head turn 45 degrees to the right or left      No results found for any visits on 01/08/24.    The 10-year ASCVD risk score (Arnett DK, et al., 2019) is: 0.4%    Assessment & Plan:   Ear congestion symptoms.  Minimal cerumen right canal removed with curette She has had some very transient dizziness which sounds like transient vertigo.  None reproduced on exam today.  Reviewed red flags for more worrisome vertigo.  Follow-up for any persistent dizziness or other concerns  Wolm Scarlet, MD

## 2024-01-13 ENCOUNTER — Telehealth: Admitting: Family Medicine

## 2024-01-13 DIAGNOSIS — J029 Acute pharyngitis, unspecified: Secondary | ICD-10-CM | POA: Diagnosis not present

## 2024-01-13 MED ORDER — LIDOCAINE VISCOUS HCL 2 % MT SOLN: 15.0000 mL | OROMUCOSAL | 0 refills | Status: DC | PRN

## 2024-01-13 NOTE — Progress Notes (Signed)
 Virtual Visit Consent   Sharon Holmes, you are scheduled for a virtual visit with a [facility] provider today. Just as with appointments in the office, your consent must be obtained to participate. Your consent will be active for this visit and any virtual visit you may have with one of our providers in the next 365 days. If you have a MyChart account, a copy of this consent can be sent to you electronically.  As this is a virtual visit, video technology does not allow for your provider to perform a traditional examination. This may limit your provider's ability to fully assess your condition. If your provider identifies any concerns that need to be evaluated in person or the need to arrange testing (such as labs, EKG, etc.), we will make arrangements to do so. Although advances in technology are sophisticated, we cannot ensure that it will always work on either your end or our end. If the connection with a video visit is poor, the visit may have to be switched to a telephone visit. With either a video or telephone visit, we are not always able to ensure that we have a secure connection.  By engaging in this virtual visit, you consent to the provision of healthcare and authorize for your insurance to be billed (if applicable) for the services provided during this visit. Depending on your insurance coverage, you may receive a charge related to this service.  I need to obtain your verbal consent now. Are you willing to proceed with your visit today? Sharon Holmes has provided verbal consent on 01/13/2024 for a virtual visit (video or telephone). Sharon CHRISTELLA Barefoot, NP  Date: 01/13/2024 11:00 AM   Virtual Visit via Video Note   I, Sharon Holmes, connected with  Sharon Holmes  ([id number], 41/17/1982) on 01/13/24 at 11:00 AM EST by a video-enabled telemedicine application and verified that I am speaking with the correct person using two identifiers.  Location: Patient: Virtual Visit Location Patient:  Home Provider: Virtual Visit Location Provider: Home Office   I discussed the limitations of evaluation and management by telemedicine and the availability of in person appointments. The patient expressed understanding and agreed to proceed.    History of Present Illness: Sharon Holmes is a 43 y.o. who identifies as a female who was assigned female at birth, and is being seen today for sore throat  Onset was yesterday with sore throat that has progressed to feel like sharp razor blade sensations.  Associated symptoms are mild fatigue  Modifying factors are Tylenol  and fluids  Denies chest pain, shortness of breath, fevers, chills  Exposure to sick contacts- known with known children with covid at school  COVID test:  neg this morning   Problems:  Patient Active Problem List   Diagnosis Date Noted   Well woman exam with routine gynecological exam 03/25/2023   Major depressive disorder, recurrent episode, moderate (HCC) 12/27/2017   Generalized anxiety disorder 12/27/2017   Cyst of right ovary    Endometriosis of ovary    Migraines 02/20/2015   Ovarian cancer (HCC) 11/06/2011    Allergies:  Allergies  Allergen Reactions   Amoxicillin     Has patient had a PCN reaction causing immediate rash, facial/tongue/throat swelling, SOB or lightheadedness with hypotension: No Has patient had a PCN reaction causing severe rash involving mucus membranes or skin necrosis: No Has patient had a PCN reaction that required hospitalization No Has patient had a PCN reaction occurring within the last 10 years:NO If all  of the above answers are NO, then may proceed with Cephalosporin use. Leg cramps and fever  Confirmed NO to all questions 10/09/2022   Aspirin     Throat gets itchy   Nsaids Itching    Itchy throat and gums, throat closes up; palpitations    Trintellix  [Vortioxetine ] Other (See Comments)    Severe constipation   Medications:  Current Outpatient Medications:    acetaminophen   (TYLENOL ) 500 MG tablet, Take 500 mg by mouth every 6 (six) hours as needed., Disp: , Rfl:    ALPRAZolam  (XANAX ) 0.25 MG tablet, Take 1-2 tablets (0.25-0.5 mg total) by mouth 2 (two) times daily as needed for anxiety., Disp: 60 tablet, Rfl: 2   BIOTIN PO, Take by mouth., Disp: , Rfl:    buPROPion  (WELLBUTRIN  XL) 300 MG 24 hr tablet, TAKE 1 TABLET BY MOUTH EVERY DAY, Disp: 90 tablet, Rfl: 0   Calcium Carbonate (CALCIUM 500 PO), Take by mouth., Disp: , Rfl:    cetirizine (ZYRTEC) 10 MG chewable tablet, Chew 10 mg by mouth daily., Disp: , Rfl:    Cyanocobalamin (B-12) 2500 MCG SUBL, Place 1 drop under the tongue daily., Disp: , Rfl:    DHA-EPA-Vitamin E (OMEGA-3 COMPLEX PO), Take by mouth., Disp: , Rfl:    Ergocalciferol  (VITAMIN D2) 50 MCG (2000 UT) TABS, Take 1 tablet by mouth daily., Disp: , Rfl:    MAGNESIUM PO, Take by mouth., Disp: , Rfl:    neomycin -polymyxin-hydrocortisone (CORTISPORIN) OTIC solution, Place 4 drops into the right ear 4 (four) times daily., Disp: 10 mL, Rfl: 0  Observations/Objective: Patient is well-developed, well-nourished in no acute distress.  Resting comfortably at home.  Head is normocephalic, atraumatic.  No labored breathing.  Speech is clear and coherent with logical content.  Patient is alert and oriented at baseline.    Assessment and Plan:  1. Viral pharyngitis (Primary)   -Suspect viral pharyngitis -Repeat COVID test tomorrow - Discussed OTC management - Will prescribe Viscous lidocaine  for symptomatic relief - May use tylenol  as needed for pain, fevers, body aches - Salt water  gargles - Hydration and voice rest - Seek in person evaluation if worsening or fails to improve    Reviewed side effects, risks and benefits of medication.    Patient acknowledged agreement and understanding of the plan.   Past Medical, Surgical, Social History, Allergies, and Medications have been Reviewed.    Follow Up Instructions: I discussed the assessment  and treatment plan with the patient. The patient was provided an opportunity to ask questions and all were answered. The patient agreed with the plan and demonstrated an understanding of the instructions.  A copy of instructions were sent to the patient via MyChart unless otherwise noted below.   Patient has requested to receive PHI (AVS, Work Notes, etc) pertaining to this video visit through e-mail as they are currently without active MyChart. They have voiced understand that email is not considered secure and their health information could be viewed by someone other than the patient.   The patient was advised to call back or seek an in-person evaluation if the symptoms worsen or if the condition fails to improve as anticipated.    Sharon CHRISTELLA Barefoot, NP

## 2024-01-13 NOTE — Patient Instructions (Addendum)
 Sharon Holmes, thank you for joining Sharon CHRISTELLA Barefoot, NP for today's virtual visit.  While this provider is not your primary care provider (PCP), if your PCP is located in our provider database this encounter information will be shared with them immediately following your visit.   A [facility] MyChart account gives you access to today's visit and all your visits, tests, and labs performed at [facility]  click here if you don't have a Sharon Holmes MyChart account or go to [email or web address]  Consent: (Patient) Sharon Holmes provided verbal consent for this virtual visit at the beginning of the encounter.  Current Medications:  Current Outpatient Medications:    lidocaine  (XYLOCAINE ) 2 % solution, Use as directed 15 mLs in the mouth or throat as needed for mouth pain., Disp: 100 mL, Rfl: 0   acetaminophen  (TYLENOL ) 500 MG tablet, Take 500 mg by mouth every 6 (six) hours as needed., Disp: , Rfl:    ALPRAZolam  (XANAX ) 0.25 MG tablet, Take 1-2 tablets (0.25-0.5 mg total) by mouth 2 (two) times daily as needed for anxiety., Disp: 60 tablet, Rfl: 2   BIOTIN PO, Take by mouth., Disp: , Rfl:    buPROPion  (WELLBUTRIN  XL) 300 MG 24 hr tablet, TAKE 1 TABLET BY MOUTH EVERY DAY, Disp: 90 tablet, Rfl: 0   Calcium Carbonate (CALCIUM 500 PO), Take by mouth., Disp: , Rfl:    cetirizine (ZYRTEC) 10 MG chewable tablet, Chew 10 mg by mouth daily., Disp: , Rfl:    Cyanocobalamin (B-12) 2500 MCG SUBL, Place 1 drop under the tongue daily., Disp: , Rfl:    DHA-EPA-Vitamin E (OMEGA-3 COMPLEX PO), Take by mouth., Disp: , Rfl:    Ergocalciferol  (VITAMIN D2) 50 MCG (2000 UT) TABS, Take 1 tablet by mouth daily., Disp: , Rfl:    MAGNESIUM PO, Take by mouth., Disp: , Rfl:    neomycin -polymyxin-hydrocortisone (CORTISPORIN) OTIC solution, Place 4 drops into the right ear 4 (four) times daily., Disp: 10 mL, Rfl: 0   Medications ordered in this encounter:  Meds ordered this encounter  Medications    lidocaine  (XYLOCAINE ) 2 % solution    Sig: Use as directed 15 mLs in the mouth or throat as needed for mouth pain.    Dispense:  100 mL    Refill:  0    Supervising Provider:   BLAISE ALEENE Holmes [[id number]]     *If you need refills on other medications prior to your next appointment, please contact your pharmacy*  Follow-Up: Call back or seek an in-person evaluation if the symptoms worsen or if the condition fails to improve as anticipated.  [facility] [phone]  Other Instructions    -Suspect viral pharyngitis -Repeat COVID test tomorrow - Discussed OTC management - Will prescribe Viscous lidocaine  for symptomatic relief - May use tylenol  as needed for pain, fevers, body aches - Salt water  gargles - Hydration and voice rest - Seek in person evaluation if worsening or fails to improve    If you have been instructed to have an in-person evaluation today at a local Urgent Care facility, please use the link below. It will take you to a list of all of our available [facility], including address, phone number and hours of operation. Please do not delay care.  [facility]  If you or a family member do not have a primary care provider, use the link below to schedule a visit and establish care. When you choose a [facility] primary  care physician or advanced practice provider, you gain a long-term partner in health. Find a Primary Care Provider  Learn more about [facility]'s in-office and virtual care options: [facility] - Get Care Now

## 2024-02-01 ENCOUNTER — Ambulatory Visit (INDEPENDENT_AMBULATORY_CARE_PROVIDER_SITE_OTHER): Admitting: Physician Assistant

## 2024-02-01 ENCOUNTER — Encounter: Payer: Self-pay | Admitting: Physician Assistant

## 2024-02-01 DIAGNOSIS — F411 Generalized anxiety disorder: Secondary | ICD-10-CM

## 2024-02-01 DIAGNOSIS — F3342 Major depressive disorder, recurrent, in full remission: Secondary | ICD-10-CM | POA: Diagnosis not present

## 2024-02-01 MED ORDER — ALPRAZOLAM 0.25 MG PO TABS: 0.2500 mg | ORAL_TABLET | Freq: Two times a day (BID) | ORAL | 0 refills | Status: AC | PRN

## 2024-02-01 MED ORDER — BUPROPION HCL ER (XL) 300 MG PO TB24: 300.0000 mg | ORAL_TABLET | Freq: Every day | ORAL | 1 refills | Status: AC

## 2024-02-01 NOTE — Progress Notes (Signed)
 [facility] Med Check  Patient ID: Sharon Holmes,  MRN: [id number]  PCP: Lendia Boby CROME, NP-C  Date of Evaluation: 02/01/2024 Time spent:20 minutes  Chief Complaint:  Chief Complaint   Anxiety; Depression; Follow-up    HISTORY/CURRENT STATUS: HPI  For routine med check  Work is going well.  Back in the building now and has good boundaries.     Individual Medical History/ Review of Systems: Changes? :No     Past medications for mental health diagnoses include: Prozac, Trintellix  caused severe constipation, Cymbalta, Effexor, Lexapro, Xanax   Allergies: Amoxicillin, Aspirin, Nsaids, and Trintellix  [vortioxetine ]  Current Medications:  Current Outpatient Medications:    acetaminophen  (TYLENOL ) 500 MG tablet, Take 500 mg by mouth every 6 (six) hours as needed., Disp: , Rfl:    BIOTIN PO, Take by mouth., Disp: , Rfl:    Calcium Carbonate (CALCIUM 500 PO), Take by mouth., Disp: , Rfl:    cetirizine (ZYRTEC) 10 MG chewable tablet, Chew 10 mg by mouth daily., Disp: , Rfl:    Cyanocobalamin (B-12) 2500 MCG SUBL, Place 1 drop under the tongue daily., Disp: , Rfl:    DHA-EPA-Vitamin E (OMEGA-3 COMPLEX PO), Take by mouth., Disp: , Rfl:    Ergocalciferol  (VITAMIN D2) 50 MCG (2000 UT) TABS, Take 1 tablet by mouth daily., Disp: , Rfl:    MAGNESIUM PO, Take by mouth., Disp: , Rfl:    ALPRAZolam  (XANAX ) 0.25 MG tablet, Take 1-2 tablets (0.25-0.5 mg total) by mouth 2 (two) times daily as needed for anxiety., Disp: 60 tablet, Rfl: 0   buPROPion  (WELLBUTRIN  XL) 300 MG 24 hr tablet, Take 1 tablet (300 mg total) by mouth daily., Disp: 90 tablet, Rfl: 1   lidocaine  (XYLOCAINE ) 2 % solution, Use as directed 15 mLs in the mouth or throat as needed for mouth pain., Disp: 100 mL, Rfl: 0 Medication Side Effects: none  Family Medical/ Social History: Changes? None  MENTAL HEALTH EXAM:  Last menstrual period 01/01/2024.There is no height or weight on file to calculate BMI.  General Appearance:  Casual and Well Groomed  Eye Contact:  Good  Speech:  Clear and Coherent and Normal Rate  Volume:  Normal  Mood:  Euthymic  Affect:  Congruent  Thought Process:  Goal Directed and Descriptions of Associations: Circumstantial  Orientation:  Full (Time, Place, and Person)  Thought Content: Illogical   Suicidal Thoughts:  No  Homicidal Thoughts:  No  Memory:  WNL  Judgement:  Good  Insight:  Good  Psychomotor Activity:  Normal  Concentration:  Concentration: Good and Attention Span: Good  Recall:  Good  Fund of Knowledge: Good  Language: Good  Assets:  Desire for Improvement Financial Resources/Insurance Housing Transportation Vocational/Educational  ADL's:  Intact  Cognition: WNL  Prognosis:  Good   DIAGNOSES:    ICD-10-CM   1. Recurrent major depressive disorder, in full remission  F33.42     2. Generalized anxiety disorder  F41.1 buPROPion  (WELLBUTRIN  XL) 300 MG 24 hr tablet     Receiving Psychotherapy: No  Matt Sixberry  in the past.  Hasn't needed to go.   RECOMMENDATIONS:  PDMP reviewed.  Xanax  filled 03/30/2023.  Gabapentin  filled 03/30/2023. I provided approximately  20  minutes of face to face time during this encounter, including time spent before and after the visit in records review, medical decision making, counseling pertinent to today's visit, and charting.   She is doing well so no changes need to be made.  Continue Xanax  0.25 mg, 1-2  p.o. twice daily as needed anxiety. Continue Wellbutrin  XL 300 mg, 1 p.o. daily. Continue vitamins as listed on med list. Adina Sixberry in the past.  She has not needed counseling lately but will restart if needed. Return in 6 months.    Verneita Cooks, PA-C

## 2024-02-24 ENCOUNTER — Encounter: Payer: BC Managed Care – PPO | Admitting: Family Medicine

## 2024-03-08 ENCOUNTER — Encounter: Payer: Self-pay | Admitting: Family Medicine

## 2024-03-08 ENCOUNTER — Ambulatory Visit: Admitting: Family Medicine

## 2024-03-08 VITALS — BP 104/70 | HR 83 | Temp 97.8°F | Ht 60.0 in | Wt 128.0 lb

## 2024-03-08 DIAGNOSIS — E559 Vitamin D deficiency, unspecified: Secondary | ICD-10-CM | POA: Diagnosis not present

## 2024-03-08 DIAGNOSIS — E7800 Pure hypercholesterolemia, unspecified: Secondary | ICD-10-CM

## 2024-03-08 DIAGNOSIS — Z0000 Encounter for general adult medical examination without abnormal findings: Secondary | ICD-10-CM

## 2024-03-08 DIAGNOSIS — Z1159 Encounter for screening for other viral diseases: Secondary | ICD-10-CM

## 2024-03-08 DIAGNOSIS — Z0001 Encounter for general adult medical examination with abnormal findings: Secondary | ICD-10-CM

## 2024-03-08 DIAGNOSIS — L719 Rosacea, unspecified: Secondary | ICD-10-CM | POA: Insufficient documentation

## 2024-03-08 DIAGNOSIS — R200 Anesthesia of skin: Secondary | ICD-10-CM

## 2024-03-08 DIAGNOSIS — L608 Other nail disorders: Secondary | ICD-10-CM

## 2024-03-08 NOTE — Patient Instructions (Signed)
Please return for fasting labs.

## 2024-03-08 NOTE — Progress Notes (Signed)
 "  Complete physical exam  Patient: Sharon Holmes   DOB: [birth year]   43 y.o. Female  MRN: [id number]  Subjective:    Chief Complaint  Patient presents with   Annual Exam   She is here for a complete physical exam.   Discussed the use of AI scribe software for clinical note transcription with the patient, who gave verbal consent to proceed.  History of Present Illness [name] is a 43 year old female who presents for a fasting comprehensive physical exam.  Breast health - Recent mammogram shows improvement in prior right breast findings - Currently on annual mammography follow-up - Up to date with OBGYN care  Hyperlipidemia - Elevated cholesterol - Not fasting today after eating breakfast  Vitamin d  deficiency - Takes daily vitamin D  supplement - Uncertain of supplement dose  Cognitive concerns - Family history of Alzheimer's disease (maternal grandmother) and dementia (great-aunt) - Concerned about future cognitive health - Interested in advance directives and end-of-life planning - Lifelong forgetfulness without recent change  Fatigue and energy level - Baseline energy level is five out of ten - No new fatigue - 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 - Stopped using Q-tips, resulting in perceived improvement in ear health  Gastrointestinal symptoms - Recent constipation during stress and travel, resolved with magnesium citrate - Bowel movements usually regular  Nail changes - New ridges and cracking in nails  Neurological symptoms - Occasional cheek numbness on left, not currently   Thyroid disease risk - Mother has thyroid disease  Cold sores - Remote history of cold sores - No recent outbreaks  Headaches - Tracks headache frequency - Headaches less frequent than expected      Health Maintenance  Topic Date Due   HIV Screening  Never done   Hepatitis C Screening  Never  done   DTaP/Tdap/Td vaccine (1 - Tdap) Never done   Hepatitis B Vaccine (1 of 3 - 19+ 3-dose series) Never done   COVID-19 Vaccine (3 - Pfizer risk series) 03/24/2024*   Flu Shot  06/07/2024*   Breast Cancer Screening  10/06/2025   Pap with HPV screening  02/27/2027   HPV Vaccine  Completed   Pneumococcal Vaccine  Aged Out   Meningitis B Vaccine  Aged Out  *Topic was postponed. The date shown is not the original due date.     Depression screening:    03/08/2024   10:07 AM 03/25/2023    3:38 PM 03/25/2023    2:53 PM  Depression screen PHQ 2/9  Decreased Interest 0 0 0  Down, Depressed, Hopeless 0 1 1  PHQ - 2 Score 0 1 1  Altered sleeping 0 1   Tired, decreased energy 0 1   Change in appetite 0 0   Feeling bad or failure about yourself  0 2   Trouble concentrating 0 2   Moving slowly or fidgety/restless 0 0   Suicidal thoughts 0 0   PHQ-9 Score 0 7       Data saved with a previous flowsheet row definition   Anxiety Screening:     No data to display           Patient Care Team: Lendia Boby CROME, NP-C as PCP - General (Family Medicine) Jannis Kate Norris, MD as Consulting Physician (Obstetrics and Gynecology)   Show/hide medication list[1]  Review of Systems  Constitutional:  Negative for chills, fever, malaise/fatigue and  weight loss.  HENT:  Negative for congestion, ear pain, sinus pain and sore throat.   Eyes:  Negative for blurred vision, double vision and pain.  Respiratory:  Negative for cough, shortness of breath and wheezing.   Cardiovascular:  Negative for chest pain, palpitations and leg swelling.  Gastrointestinal:  Negative for abdominal pain, constipation, diarrhea, nausea and vomiting.  Genitourinary:  Negative for dysuria, frequency and urgency.  Musculoskeletal:  Negative for back pain, joint pain and myalgias.  Skin:  Negative for rash.       Fingernails with more pronounced ridges   Neurological:  Positive for headaches. Negative for  dizziness, tingling and focal weakness.       Infrequent focal area of numbness on left cheek  Psychiatric/Behavioral:  Negative for depression. The patient is not nervous/anxious.        Objective:    BP 104/70   Pulse 83   Temp 97.8 F (36.6 C) (Temporal)   Ht 5' (1.524 m)   Wt 128 lb (58.1 kg)   SpO2 99%   BMI 25.00 kg/m  BP Readings from Last 3 Encounters:  03/08/24 104/70  01/08/24 100/70  09/25/23 100/70   Wt Readings from Last 3 Encounters:  03/08/24 128 lb (58.1 kg)  01/08/24 129 lb (58.5 kg)  09/25/23 123 lb (55.8 kg)    Physical Exam Constitutional:      General: She is not in acute distress.    Appearance: She is not ill-appearing.  HENT:     Right Ear: Tympanic membrane, ear canal and external ear normal.     Left Ear: Tympanic membrane, ear canal and external ear normal.     Nose: Nose normal.     Mouth/Throat:     Mouth: Mucous membranes are moist.     Pharynx: Oropharynx is clear.  Eyes:     Extraocular Movements: Extraocular movements intact.     Conjunctiva/sclera: Conjunctivae normal.     Pupils: Pupils are equal, round, and reactive to light.  Neck:     Thyroid: No thyroid mass, thyromegaly or thyroid tenderness.  Cardiovascular:     Rate and Rhythm: Normal rate and regular rhythm.     Pulses: Normal pulses.     Heart sounds: Normal heart sounds.  Pulmonary:     Effort: Pulmonary effort is normal.     Breath sounds: Normal breath sounds.  Abdominal:     General: Bowel sounds are normal.     Palpations: Abdomen is soft.     Tenderness: There is no abdominal tenderness. There is no right CVA tenderness, left CVA tenderness, guarding or rebound.  Musculoskeletal:        General: Normal range of motion.     Cervical back: Normal range of motion and neck supple. No tenderness.     Right lower leg: No edema.     Left lower leg: No edema.  Lymphadenopathy:     Cervical: No cervical adenopathy.  Skin:    General: Skin is warm and dry.      Findings: No lesion or rash.  Neurological:     General: No focal deficit present.     Mental Status: She is alert and oriented to person, place, and time.     Cranial Nerves: No cranial nerve deficit.     Sensory: No sensory deficit.     Motor: No weakness.     Gait: Gait normal.  Psychiatric:        Mood and Affect: Mood normal.  Behavior: Behavior normal.        Thought Content: Thought content normal.      No results found for any visits on 03/08/24.    Assessment & Plan:    Routine Health Maintenance and Physical Exam Problem List Items Addressed This Visit   None Visit Diagnoses       Encounter for general adult medical examination with abnormal findings    -  Primary     Pure hypercholesterolemia       Relevant Orders   Lipid panel     Vitamin D  deficiency       Relevant Orders   VITAMIN D  25 Hydroxy (Vit-D Deficiency, Fractures)     Acquired deformity of nail of finger       Relevant Orders   CBC with Differential/Platelet   Comprehensive metabolic panel with GFR   TSH   T4, free     Numbness of face       Relevant Orders   CBC with Differential/Platelet   Comprehensive metabolic panel with GFR   Vitamin B12     Encounter for screening for other viral diseases       Relevant Orders   Hepatitis C antibody   HIV Antibody (routine testing w rflx)       Assessment and Plan Assessment & Plan Pure hypercholesterolemia Cholesterol levels have been elevated. Labs were not drawn today due to recent food intake, which may affect triglyceride and glucose levels. - Ordered fasting lipid panel for Friday morning - Advised to drink at least one glass of water  before lab draw  Vitamin D  deficiency Currently taking a vitamin D  supplement. No recent deficiency noted, but levels will be checked to ensure adequacy. - Ordered vitamin D  level as part of lab work - Continue current vitamin D  supplementation  History of acute otitis media and ear canal  disorders Previous ear infections and cerumen impaction. Currently, ear canals are clean without Q-tip use, and eardrums appear normal. - Advised to continue avoiding Q-tip use for ear cleaning  General Health Maintenance - Preventive health care reviewed.  Counseling on healthy lifestyle including diet and exercise.  Recommend regular dental and eye exams.  Immunizations reviewed.   Up to date with mammogram and tetanus vaccination. OBGYN visit scheduled for January. No need for colonoscopy yet. Discussed advanced directives and end-of-life care planning. - Provided paperwork for advanced directives and end-of-life care planning      Return in about 1 year (around 03/08/2025).     Boby Mackintosh, NP-C      [1]  Outpatient Medications Prior to Visit  Medication Sig   acetaminophen  (TYLENOL ) 500 MG tablet Take 500 mg by mouth every 6 (six) hours as needed.   ALPRAZolam  (XANAX ) 0.25 MG tablet Take 1-2 tablets (0.25-0.5 mg total) by mouth 2 (two) times daily as needed for anxiety.   BIOTIN PO Take by mouth.   buPROPion  (WELLBUTRIN  XL) 300 MG 24 hr tablet Take 1 tablet (300 mg total) by mouth daily.   Calcium Carbonate (CALCIUM 500 PO) Take by mouth.   cetirizine (ZYRTEC) 10 MG chewable tablet Chew 10 mg by mouth daily.   Cyanocobalamin (B-12) 2500 MCG SUBL Place 1 drop under the tongue daily.   DHA-EPA-Vitamin E (OMEGA-3 COMPLEX PO) Take by mouth.   Ergocalciferol  (VITAMIN D2) 50 MCG (2000 UT) TABS Take 1 tablet by mouth daily.   lidocaine  (XYLOCAINE ) 2 % solution Use as directed 15 mLs in the mouth or throat as needed  for mouth pain.   MAGNESIUM PO Take by mouth.   No facility-administered medications prior to visit.   "

## 2024-03-11 ENCOUNTER — Other Ambulatory Visit

## 2024-03-11 DIAGNOSIS — R200 Anesthesia of skin: Secondary | ICD-10-CM | POA: Diagnosis not present

## 2024-03-11 DIAGNOSIS — E559 Vitamin D deficiency, unspecified: Secondary | ICD-10-CM

## 2024-03-11 DIAGNOSIS — Z1159 Encounter for screening for other viral diseases: Secondary | ICD-10-CM

## 2024-03-11 DIAGNOSIS — L608 Other nail disorders: Secondary | ICD-10-CM | POA: Diagnosis not present

## 2024-03-11 DIAGNOSIS — E7800 Pure hypercholesterolemia, unspecified: Secondary | ICD-10-CM

## 2024-03-11 LAB — CBC WITH DIFFERENTIAL/PLATELET
Basophils Absolute: 0.1 K/uL (ref 0.0–0.1)
Basophils Relative: 1.1 % (ref 0.0–3.0)
Eosinophils Absolute: 0.3 K/uL (ref 0.0–0.7)
Eosinophils Relative: 4.3 % (ref 0.0–5.0)
HCT: 39.6 % (ref 36.0–46.0)
Hemoglobin: 13.5 g/dL (ref 12.0–15.0)
Lymphocytes Relative: 27.7 % (ref 12.0–46.0)
Lymphs Abs: 1.9 K/uL (ref 0.7–4.0)
MCHC: 34 g/dL (ref 30.0–36.0)
MCV: 91.1 fl (ref 78.0–100.0)
Monocytes Absolute: 0.5 K/uL (ref 0.1–1.0)
Monocytes Relative: 6.9 % (ref 3.0–12.0)
Neutro Abs: 4.1 K/uL (ref 1.4–7.7)
Neutrophils Relative %: 60 % (ref 43.0–77.0)
Platelets: 235 K/uL (ref 150.0–400.0)
RBC: 4.35 Mil/uL (ref 3.87–5.11)
RDW: 14.6 % (ref 11.5–15.5)
WBC: 6.8 K/uL (ref 4.0–10.5)

## 2024-03-11 LAB — COMPREHENSIVE METABOLIC PANEL WITH GFR
ALT: 72 U/L — ABNORMAL HIGH (ref 3–35)
AST: 54 U/L — ABNORMAL HIGH (ref 5–37)
Albumin: 4.4 g/dL (ref 3.5–5.2)
Alkaline Phosphatase: 61 U/L (ref 39–117)
BUN: 13 mg/dL (ref 6–23)
CO2: 25 meq/L (ref 19–32)
Calcium: 9.2 mg/dL (ref 8.4–10.5)
Chloride: 104 meq/L (ref 96–112)
Creatinine, Ser: 0.74 mg/dL (ref 0.40–1.20)
GFR: 100.16 mL/min
Glucose, Bld: 86 mg/dL (ref 70–99)
Potassium: 4 meq/L (ref 3.5–5.1)
Sodium: 135 meq/L (ref 135–145)
Total Bilirubin: 0.4 mg/dL (ref 0.2–1.2)
Total Protein: 7.1 g/dL (ref 6.0–8.3)

## 2024-03-11 LAB — T4, FREE: Free T4: 0.74 ng/dL (ref 0.60–1.60)

## 2024-03-11 LAB — VITAMIN B12: Vitamin B-12: >1500 pg/mL — ABNORMAL HIGH (ref 211–911)

## 2024-03-11 LAB — LIPID PANEL
Cholesterol: 215 mg/dL — ABNORMAL HIGH (ref 28–200)
HDL: 50.2 mg/dL
LDL Cholesterol: 129 mg/dL — ABNORMAL HIGH (ref 10–99)
NonHDL: 164.48
Total CHOL/HDL Ratio: 4
Triglycerides: 179 mg/dL — ABNORMAL HIGH (ref 10.0–149.0)
VLDL: 35.8 mg/dL (ref 0.0–40.0)

## 2024-03-11 LAB — TSH: TSH: 2.17 u[IU]/mL (ref 0.35–5.50)

## 2024-03-11 LAB — VITAMIN D 25 HYDROXY (VIT D DEFICIENCY, FRACTURES): VITD: 39.51 ng/mL (ref 30.00–100.00)

## 2024-03-12 LAB — HEPATITIS C ANTIBODY: Hepatitis C Ab: NONREACTIVE

## 2024-03-12 LAB — HIV ANTIBODY (ROUTINE TESTING W REFLEX)
HIV 1&2 Ab, 4th Generation: NONREACTIVE
HIV FINAL INTERPRETATION: NEGATIVE

## 2024-03-15 ENCOUNTER — Ambulatory Visit: Payer: Self-pay | Admitting: Family Medicine

## 2024-03-15 ENCOUNTER — Other Ambulatory Visit: Payer: Self-pay | Admitting: Family Medicine

## 2024-03-15 DIAGNOSIS — R7989 Other specified abnormal findings of blood chemistry: Secondary | ICD-10-CM

## 2024-03-15 NOTE — Progress Notes (Signed)
 She needs to follow up for liver function testing in 3-4 weeks. A lab visit is is fine and orders have been placed.

## 2024-03-16 ENCOUNTER — Telehealth: Payer: Self-pay

## 2024-03-16 NOTE — Telephone Encounter (Signed)
 Copied from CRM #[id number]. Topic: Clinical - Medication Question >> Mar 16, 2024 11:20 AM Pinkey ORN wrote: Reason for CRM: Medication Question >> Mar 16, 2024 11:21 AM Pinkey ORN wrote: Patient states she saw the message sent via mychart in regards to her lab results. Patient states that she's allergic to everything except Tylenol , therefor she's wanting to know is there anything she could take whenever she's experiencing headaches since being advised not to take OTC Tylenol ?

## 2024-03-16 NOTE — Telephone Encounter (Signed)
 Called pt and advised per pcp can take tylenol  if absolutely necessary but try to avoid if possible. Pt verbalized understanding

## 2024-03-25 ENCOUNTER — Ambulatory Visit: Payer: 59 | Admitting: Obstetrics and Gynecology

## 2024-03-28 ENCOUNTER — Ambulatory Visit: Admitting: Obstetrics and Gynecology

## 2024-03-31 ENCOUNTER — Encounter: Payer: Self-pay | Admitting: Obstetrics and Gynecology

## 2024-03-31 ENCOUNTER — Ambulatory Visit: Payer: Self-pay | Admitting: Obstetrics and Gynecology

## 2024-03-31 ENCOUNTER — Ambulatory Visit (INDEPENDENT_AMBULATORY_CARE_PROVIDER_SITE_OTHER): Admitting: Obstetrics and Gynecology

## 2024-03-31 VITALS — BP 104/62 | HR 98 | Temp 98.7°F | Ht 60.5 in | Wt 128.0 lb

## 2024-03-31 DIAGNOSIS — Z8543 Personal history of malignant neoplasm of ovary: Secondary | ICD-10-CM | POA: Diagnosis not present

## 2024-03-31 DIAGNOSIS — Z01419 Encounter for gynecological examination (general) (routine) without abnormal findings: Secondary | ICD-10-CM

## 2024-03-31 DIAGNOSIS — N898 Other specified noninflammatory disorders of vagina: Secondary | ICD-10-CM | POA: Diagnosis not present

## 2024-03-31 DIAGNOSIS — B3731 Acute candidiasis of vulva and vagina: Secondary | ICD-10-CM | POA: Diagnosis not present

## 2024-03-31 LAB — WET PREP FOR TRICH, YEAST, CLUE

## 2024-03-31 MED ORDER — FLUCONAZOLE 150 MG PO TABS: 150.0000 mg | ORAL_TABLET | Freq: Once | ORAL | 0 refills | Status: AC

## 2024-03-31 NOTE — Assessment & Plan Note (Signed)
 Cervical cancer screening performed according to ASCCP guidelines. Encouraged annual mammogram screening Labs and immunizations with her primary Encouraged safe sexual practices as indicated Encouraged healthy lifestyle practices with diet and exercise For patients under 42yo, I recommend 1000mg  calcium daily and 600IU of vitamin D daily.

## 2024-03-31 NOTE — Patient Instructions (Signed)

## 2024-03-31 NOTE — Progress Notes (Signed)
 "  44 y.o. [name] female with history of stage IA ovarian cancer (diagnosed 2008, status post LSO), endometriosis (diagnosed 2017, s/p right ovarian cystectomy for cystadenoma, yearly CA125 recommended by GYN oncology, declined genetic testing in 2020 per Dr. Eloy note), maternal grandmother took DES here for annual exam. Single. School counselor, virtual.  Patient's last menstrual period was 03/24/2024 (exact date). Period Duration (Days): 3-4 Period Pattern: Regular Menstrual Flow: Moderate Menstrual Control: Maxi pad Dysmenorrhea: (!) Mild Dysmenorrhea Symptoms: Headache  She reports vaginal irritation at times, vaginal itching off and on, uses cream sometimes if needed.   Abnormal bleeding: none Pelvic discharge or pain: none Breast mass, nipple discharge or skin changes : none  Birth control: abstinence Last PAP:     Component Value Date/Time   DIAGPAP  02/26/2022 1441    - Negative for intraepithelial lesion or malignancy (NILM)   DIAGPAP  03/30/2017 0000    NEGATIVE FOR INTRAEPITHELIAL LESIONS OR MALIGNANCY.   HPVHIGH Negative 02/26/2022 1441   ADEQPAP  02/26/2022 1441    Satisfactory for evaluation; transformation zone component PRESENT.   ADEQPAP  03/30/2017 0000    Satisfactory for evaluation  endocervical/transformation zone component PRESENT.   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[facility]  PHQ-2 Total Score 1   Flowsheet Row Office Visit from 03/08/2024 in [facility]  PHQ-9 Total Score 0    GYN HISTORY: Stage IA ovarian cancer (diagnosed 2018, status post LSO) Endometriosis Cystadenoma Grandmother took DES  OB History  Gravida Para Term Preterm AB Living  0 0 0 0 0 0  SAB IAB Ectopic Multiple Live Births  0 0 0 0     Past Medical History:  Diagnosis Date   Anxiety     Depression    Dizziness    GERD (gastroesophageal reflux disease)    Migraine    with aura around menses   Ovarian cancer (HCC) 2008   Stage 1A    Past Surgical History:  Procedure Laterality Date   APPENDECTOMY  08/2006   BUNIONECTOMY  02/2007   CYSTECTOMY  07/2008   from right ovary   ROBOTIC ASSISTED LAPAROSCOPIC OVARIAN CYSTECTOMY Right 06/14/2015   Procedure: ROBOTIC LAPAROSCOPIC RIGHT OVARIAN CYSTECTOMY/;  Surgeon: Maurilio Eloy, MD;  Location: [facility];  Service: Gynecology;  Laterality: Right;   SALPINGOOPHORECTOMY  07/2006   Left for Stage 1A ovarian cancer   WISDOM TOOTH EXTRACTION  2015    Current Outpatient Medications on File Prior to Visit  Medication Sig Dispense Refill   acetaminophen  (TYLENOL ) 500 MG tablet Take 500 mg by mouth every 6 (six) hours as needed.     ALPRAZolam  (XANAX ) 0.25 MG tablet Take 1-2 tablets (0.25-0.5 mg total) by mouth 2 (two) times daily as needed for anxiety. 60 tablet 0   BIOTIN PO Take by mouth.     buPROPion  (WELLBUTRIN  XL) 300 MG 24 hr tablet Take 1 tablet (300 mg total) by mouth daily. 90 tablet 1   Calcium Carbonate (CALCIUM 500 PO) Take by mouth.     cetirizine (ZYRTEC) 10 MG chewable tablet Chew 10 mg by mouth daily.     Cyanocobalamin  (B-12) 2500 MCG SUBL Place 1 drop under the tongue daily.     DHA-EPA-Vitamin E (OMEGA-3 COMPLEX PO) Take by mouth.     Ergocalciferol  (VITAMIN D2) 50 MCG (2000 UT) TABS  Take 1 tablet by mouth daily.     MAGNESIUM PO Take by mouth.     No current facility-administered medications on file prior to visit.    Social History   Socioeconomic History   Marital status: Single    Spouse name: Not on file   Number of children: Not on file   Years of education: Not on file   Highest education level: Master's degree (e.g., MA, MS, MEng, MEd, MSW, MBA)  Occupational History   Not on file  Tobacco Use   Smoking status: Never   Smokeless tobacco: Never  Vaping Use   Vaping status: Never Used  Substance and  Sexual Activity   Alcohol use: Yes    Comment: 1-2 per month   Drug use: No   Sexual activity: Not Currently    Partners: Male    Birth control/protection: Abstinence  Other Topics Concern   Not on file  Social History Narrative   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[employer]   0 children   Exercise-treadmill, weights 1-2 times per week   Social Drivers of Health   Tobacco Use: Low Risk (03/31/2024)   Patient History    Smoking Tobacco Use: Never    Smokeless Tobacco Use: Never    Passive Exposure: Not on file  Financial Resource Strain: Not on file  Food Insecurity: Not on file  Transportation Needs: Not on file  Physical Activity: Not on file  Stress: Not on file  Social Connections: Not on file  Intimate Partner Violence: Not on file  Depression (PHQ2-9): Low Risk (03/31/2024)   Depression (PHQ2-9)    PHQ-2 Score: 1  Alcohol Screen: Not on file  Housing: Not on file  Utilities: Not on file  Health Literacy: Not on file    Family History  Problem Relation Age of Onset   Fibromyalgia Mother    Thyroid disease Mother    Blindness Father    Alcohol abuse Father    Dementia Maternal Grandmother    Heart attack Maternal Grandfather    Heart disease Maternal Grandfather        Heart attack; CAD    Diabetes Maternal Aunt     Allergies  Allergen Reactions   Amoxicillin     Has patient had a PCN reaction causing immediate rash, facial/tongue/throat swelling, SOB or lightheadedness with hypotension: No Has patient had a PCN reaction causing severe rash involving mucus membranes or skin necrosis: No Has patient had a PCN reaction that required hospitalization No Has patient had a PCN reaction occurring within the last 10 years:NO If all of the above answers are NO, then may proceed with Cephalosporin use. Leg cramps and fever  Confirmed NO to all questions 10/09/2022   Aspirin     Throat gets itchy   Nsaids Itching    Itchy throat and gums, throat closes up;  palpitations    Trintellix  [Vortioxetine ] Other (See Comments)    Severe constipation      PE Today's Vitals   03/31/24 1603  BP: 104/62  Pulse: 98  Temp: 98.7 F (37.1 C)  TempSrc: Oral  SpO2: 100%  Weight: 128 lb (58.1 kg)  Height: 5' 0.5 (1.537 m)   Body mass index is 24.59 kg/m.  Physical Exam Vitals reviewed. Exam conducted with a chaperone present.  Constitutional:      General: She is not in acute distress.    Appearance: Normal appearance.  HENT:     Head: Normocephalic and atraumatic.  Nose: Nose normal.  Eyes:     Extraocular Movements: Extraocular movements intact.     Conjunctiva/sclera: Conjunctivae normal.  Neck:     Thyroid: No thyroid mass, thyromegaly or thyroid tenderness.  Pulmonary:     Effort: Pulmonary effort is normal.  Chest:     Chest wall: No mass or tenderness.  Breasts:    Right: Normal. No swelling, mass, nipple discharge, skin change or tenderness.     Left: Normal. No swelling, mass, nipple discharge, skin change or tenderness.  Abdominal:     General: There is no distension.     Palpations: Abdomen is soft.     Tenderness: There is no abdominal tenderness.  Genitourinary:    General: Normal vulva.     Exam position: Lithotomy position.     Urethra: No prolapse.     Vagina: Normal. No vaginal discharge or bleeding.     Cervix: Normal. No lesion.     Uterus: Normal. Not enlarged and not tender.      Adnexa: Right adnexa normal and left adnexa normal.  Musculoskeletal:        General: Normal range of motion.     Cervical back: Normal range of motion.  Lymphadenopathy:     Upper Body:     Right upper body: No axillary adenopathy.     Left upper body: No axillary adenopathy.     Lower Body: No right inguinal adenopathy. No left inguinal adenopathy.  Skin:    General: Skin is warm and dry.  Neurological:     General: No focal deficit present.     Mental Status: She is alert.  Psychiatric:        Mood and Affect: Mood  normal.        Behavior: Behavior normal.      Assessment and Plan:        Well woman exam with routine gynecological exam Assessment & Plan: Cervical cancer screening performed according to ASCCP guidelines. Encouraged annual mammogram screening Labs and immunizations with her primary Encouraged safe sexual practices as indicated Encouraged healthy lifestyle practices with diet and exercise For patients under 50yo, I recommend 1000mg  calcium daily and 600IU of vitamin D  daily.   History of ovarian cancer -     CA 125 (wants to complete at [facility] office [facility]; will need to determine which lab they use) Per 2020 Dr. Eloy note, would be okay for estrogen replacement if needed  Vaginal irritation -     WET PREP FOR TRICH, YEAST, CLUE  Yeast vaginitis -     Fluconazole ; Take 1 tablet (150 mg total) by mouth once for 1 dose.  Dispense: 1 tablet; Refill: 0    Vera LULLA Pa, MD  "

## 2024-04-01 NOTE — Addendum Note (Signed)
 Addended by: DALLIE VERA GAILS on: 04/01/2024 08:26 AM   Modules accepted: Orders

## 2024-04-01 NOTE — Addendum Note (Signed)
 Addended by: DALLIE VERA GAILS on: 04/01/2024 08:33 AM   Modules accepted: Orders

## 2024-08-17 ENCOUNTER — Ambulatory Visit: Admitting: Physician Assistant

## 2025-03-28 ENCOUNTER — Encounter: Admitting: Family Medicine
